# Patient Record
Sex: Female | Born: 1970 | Race: White | Hispanic: No | Marital: Married | Smoking: Never smoker
Health system: Southern US, Community
[De-identification: ages and names within clinical notes are randomized; demographics above are authoritative.]

## PROBLEM LIST (undated history)

## (undated) DIAGNOSIS — E079 Disorder of thyroid, unspecified: Secondary | ICD-10-CM

## (undated) HISTORY — PX: UTERINE STENT PLACEMENT: SHX6655

## (undated) HISTORY — DX: Disorder of thyroid, unspecified: E07.9

## (undated) HISTORY — PX: FOOT SURGERY: SHX648

---

## 2008-07-05 ENCOUNTER — Ambulatory Visit: Payer: Self-pay

## 2008-12-31 ENCOUNTER — Ambulatory Visit: Payer: Self-pay | Admitting: Internal Medicine

## 2009-03-27 ENCOUNTER — Ambulatory Visit: Payer: Self-pay | Admitting: Family Medicine

## 2009-04-12 ENCOUNTER — Ambulatory Visit: Payer: Self-pay | Admitting: Internal Medicine

## 2009-09-23 ENCOUNTER — Ambulatory Visit: Payer: Self-pay | Admitting: Internal Medicine

## 2009-10-04 ENCOUNTER — Ambulatory Visit: Payer: Self-pay | Admitting: Family Medicine

## 2009-10-27 ENCOUNTER — Ambulatory Visit (HOSPITAL_COMMUNITY): Admission: RE | Admit: 2009-10-27 | Discharge: 2009-10-27 | Payer: Self-pay | Admitting: Gastroenterology

## 2012-07-12 ENCOUNTER — Ambulatory Visit: Payer: Self-pay | Admitting: Medical

## 2013-06-10 ENCOUNTER — Ambulatory Visit: Payer: Self-pay | Admitting: Family Medicine

## 2013-06-10 LAB — COMPREHENSIVE METABOLIC PANEL
Albumin: 3.8 g/dL (ref 3.4–5.0)
BUN: 10 mg/dL (ref 7–18)
Calcium, Total: 9.5 mg/dL (ref 8.5–10.1)
Chloride: 104 mmol/L (ref 98–107)
EGFR (African American): >60
Glucose: 113 mg/dL — ABNORMAL HIGH (ref 65–99)
Osmolality: 276 (ref 275–301)
Potassium: 4.3 mmol/L (ref 3.5–5.1)
SGOT(AST): 37 U/L (ref 15–37)
SGPT (ALT): 104 U/L — ABNORMAL HIGH (ref 12–78)

## 2013-06-10 LAB — URINALYSIS, COMPLETE
Bilirubin,UR: NEGATIVE
Glucose,UR: NEGATIVE mg/dL (ref 0–75)
Ketone: NEGATIVE
Protein: NEGATIVE
Specific Gravity: 1.02 (ref 1.003–1.030)

## 2013-06-10 LAB — CBC WITH DIFFERENTIAL/PLATELET
Basophil %: 0.7 %
HCT: 42.1 % (ref 35.0–47.0)
HGB: 14.2 g/dL (ref 12.0–16.0)
Lymphocyte #: 1.6 10*3/uL (ref 1.0–3.6)
Lymphocyte %: 26.7 %
Monocyte %: 9.1 %
Neutrophil %: 62.5 %
Platelet: 289 10*3/uL (ref 150–440)
RDW: 12.9 % (ref 11.5–14.5)

## 2014-04-07 ENCOUNTER — Ambulatory Visit: Payer: Self-pay | Admitting: Podiatry

## 2014-04-08 ENCOUNTER — Encounter: Payer: Self-pay | Admitting: Podiatry

## 2014-04-12 ENCOUNTER — Ambulatory Visit (INDEPENDENT_AMBULATORY_CARE_PROVIDER_SITE_OTHER): Payer: 59

## 2014-04-12 ENCOUNTER — Ambulatory Visit (INDEPENDENT_AMBULATORY_CARE_PROVIDER_SITE_OTHER): Payer: 59 | Admitting: Podiatry

## 2014-04-12 ENCOUNTER — Encounter: Payer: Self-pay | Admitting: Podiatry

## 2014-04-12 VITALS — BP 127/77 | HR 112 | Resp 16 | Ht 64.0 in | Wt 230.0 lb

## 2014-04-12 DIAGNOSIS — M19079 Primary osteoarthritis, unspecified ankle and foot: Secondary | ICD-10-CM

## 2014-04-12 DIAGNOSIS — M79672 Pain in left foot: Secondary | ICD-10-CM

## 2014-04-12 DIAGNOSIS — M2010 Hallux valgus (acquired), unspecified foot: Secondary | ICD-10-CM

## 2014-04-12 DIAGNOSIS — M19072 Primary osteoarthritis, left ankle and foot: Secondary | ICD-10-CM

## 2014-04-12 DIAGNOSIS — M79609 Pain in unspecified limb: Secondary | ICD-10-CM

## 2014-04-12 NOTE — Progress Notes (Signed)
She presents today for surgical consult regarding her left foot. She has a history of osteoarthritis and dorsal spurring midfoot left. She also has a chief complaint of a painful bunion first metatarsophalangeal joint of the left foot. We she was in last, we discussed surgical intervention. Currently she states that her left foot is becoming lifestyle limiting is preventing her from performing her daily activities.  Objective: Vital signs are stable she is alert and oriented x3. She has strong palpable pulses left foot. Pain on palpation to the dorsal aspect of the left foot with overwrapped riding spurring over the second and third metatarsal medial and intermediate cuneiform joint. She also has hallux abductovalgus deformity with an increase in the first intermetatarsal angle of the left foot. This is mildly tender on range of motion. Radiographs confirm increase in the first intermetatarsal angle greater than normal value of the hallux abductus angle greater than normal value. Early dislocation is also noted.   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  Plan: Discussed etiology pathology conservative versus surgical therapies at this point we went over consent form today line bylined number by number giving her ample time to ask questions she saw fit regarding an [place] bunion repair with screw left foot. As well as a dorsal tarsal exostectomy left foot. I answered all the questions regarding these procedures to the best my ability in layman's terms. I demonstrated on for patient the consent form diagrammatically exactly how procedures will be performed she understood this was amenable to it. We discussed the possible postop complications which may include but are not limited to postop pain bleeding swelling infection need for further surgery loss of vision loss in limb loss of life. She signed all 3 pages of the consent form she was given the  appropriate paperwork for the surgery center and was dispensed a Cam Walker for her postop surgical period. I will followup with her in the near future for surgery.

## 2014-06-10 ENCOUNTER — Other Ambulatory Visit: Payer: Self-pay | Admitting: Podiatry

## 2014-06-10 MED ORDER — CEPHALEXIN 500 MG PO CAPS: 500.0000 mg | ORAL_CAPSULE | Freq: Three times a day (TID) | ORAL | Status: DC

## 2014-06-10 MED ORDER — PROMETHAZINE HCL 25 MG PO TABS: 25.0000 mg | ORAL_TABLET | Freq: Three times a day (TID) | ORAL | Status: DC | PRN

## 2014-06-10 MED ORDER — OXYCODONE-ACETAMINOPHEN 10-325 MG PO TABS: ORAL_TABLET | ORAL | Status: DC

## 2014-06-11 ENCOUNTER — Encounter: Payer: Self-pay | Admitting: Podiatry

## 2014-06-11 DIAGNOSIS — M898X9 Other specified disorders of bone, unspecified site: Secondary | ICD-10-CM

## 2014-06-11 DIAGNOSIS — M2010 Hallux valgus (acquired), unspecified foot: Secondary | ICD-10-CM

## 2014-06-15 ENCOUNTER — Telehealth: Payer: Self-pay | Admitting: *Deleted

## 2014-06-15 NOTE — Telephone Encounter (Signed)
Pt called wanting to know if she needed to wear boot 24/7. Left message letting pt know yes, wear boot all day/night until come in to see dr Milinda Pointer.

## 2014-06-15 NOTE — Telephone Encounter (Signed)
Pt called wanting to know if numbness and tingling is normal. i told pt yes normal, stay off of foot, elevate and ice. Pt understood.

## 2014-06-17 ENCOUNTER — Ambulatory Visit (INDEPENDENT_AMBULATORY_CARE_PROVIDER_SITE_OTHER): Payer: 59 | Admitting: Podiatry

## 2014-06-17 ENCOUNTER — Ambulatory Visit (INDEPENDENT_AMBULATORY_CARE_PROVIDER_SITE_OTHER): Payer: 59

## 2014-06-17 ENCOUNTER — Encounter: Payer: Self-pay | Admitting: Podiatry

## 2014-06-17 VITALS — BP 130/83 | HR 92 | Temp 99.0°F | Resp 16

## 2014-06-17 DIAGNOSIS — M21612 Bunion of left foot: Secondary | ICD-10-CM

## 2014-06-17 DIAGNOSIS — M21619 Bunion of unspecified foot: Secondary | ICD-10-CM

## 2014-06-17 NOTE — Progress Notes (Signed)
She presents today one week status post [facility] bunion repair and dorsal tarsal exostectomy left foot. She denies fever chills nausea vomiting muscle aches and pains think that the bandage may been a little too tight.  Objective: Vital signs are stable she is alert and oriented x3. Dry sterile dressing was removed demonstrates mild edema no erythema cellulitis drainage or odor. Sutures are intact and she has good range of motion passive and active. Radiographic evaluation demonstrates a well healing surgical foot.  Assessment: Well-healing surgical foot left.  Plan: Redressed today with a dressed a compressive dressing will followup with her in one week.

## 2014-06-17 NOTE — Progress Notes (Signed)
Austin bunionectomy left foot   Dorsal tarsal exostectomy

## 2014-06-24 ENCOUNTER — Ambulatory Visit (INDEPENDENT_AMBULATORY_CARE_PROVIDER_SITE_OTHER): Payer: 59 | Admitting: Podiatry

## 2014-06-24 VITALS — BP 125/86 | HR 107 | Resp 16

## 2014-06-24 DIAGNOSIS — Z9889 Other specified postprocedural states: Secondary | ICD-10-CM

## 2014-06-24 DIAGNOSIS — M898X9 Other specified disorders of bone, unspecified site: Secondary | ICD-10-CM

## 2014-06-24 DIAGNOSIS — M21612 Bunion of left foot: Secondary | ICD-10-CM

## 2014-06-24 DIAGNOSIS — M21619 Bunion of unspecified foot: Secondary | ICD-10-CM

## 2014-06-24 NOTE — Progress Notes (Signed)
She presents today with her husband now status post 2 weeks [place] bunion repair with screw fixation and a dorsal tarsal exostectomy. She denies fever chills nausea vomiting muscle aches and pains. She states that she's doing very well with this.  Objective: Vital signs are stable she is alert and oriented x3. Pulses are strongly palpable. Dry sterile dressing was removed demonstrates minimal edema no erythema cellulitis drainage or odor sutures are intact margins are well coapted. She has great range of motion dorsiflexion and plantar flexion active and passive ranges. Sutures were removed today.  Assessment: Well-healing surgical foot left status post [place] bunion repair and dorsal tarsal exostectomy x2 weeks.  Plan: Placed her in a compression anklet encouraged range of motion exercises continue the use of the Darco shoe which was dispensed today for the next 3 weeks until I followup with her. She's allowed to get this wet in the shower and then soak in Epsom salts and water. I encouraged massage therapy and range of motion exercises today.

## 2014-06-30 DIAGNOSIS — M79609 Pain in unspecified limb: Secondary | ICD-10-CM

## 2014-07-14 ENCOUNTER — Ambulatory Visit (INDEPENDENT_AMBULATORY_CARE_PROVIDER_SITE_OTHER): Payer: 59 | Admitting: Podiatry

## 2014-07-14 ENCOUNTER — Ambulatory Visit (INDEPENDENT_AMBULATORY_CARE_PROVIDER_SITE_OTHER): Payer: 59

## 2014-07-14 DIAGNOSIS — M2012 Hallux valgus (acquired), left foot: Secondary | ICD-10-CM

## 2014-07-14 DIAGNOSIS — M21612 Bunion of left foot: Secondary | ICD-10-CM

## 2014-07-14 DIAGNOSIS — Z9889 Other specified postprocedural states: Secondary | ICD-10-CM

## 2014-07-14 NOTE — Progress Notes (Signed)
She presents today 5 weeks status post [facility] bunion repair left and dorsal tarsal exostectomy left she relates the numbness and swelling around her first and second toes left foot.  Objective: Vital signs are stable she is alert and oriented x3 pulses are palpable left foot. She has great range of motion of the first metatarsophalangeal joint left with minimal tenderness on palpation of the dorsal exostectomy site.  Assessment: Well-healing surgical foot left.  Plan: Followup with her in 3 weeks at which time we have to get her back into her regular pair shoes.

## 2014-07-19 ENCOUNTER — Encounter: Payer: Self-pay | Admitting: *Deleted

## 2014-08-04 ENCOUNTER — Encounter: Payer: 59 | Admitting: Podiatry

## 2014-08-16 ENCOUNTER — Ambulatory Visit (INDEPENDENT_AMBULATORY_CARE_PROVIDER_SITE_OTHER): Payer: 59

## 2014-08-16 ENCOUNTER — Ambulatory Visit (INDEPENDENT_AMBULATORY_CARE_PROVIDER_SITE_OTHER): Payer: 59 | Admitting: Podiatry

## 2014-08-16 ENCOUNTER — Other Ambulatory Visit: Payer: Self-pay | Admitting: Podiatry

## 2014-08-16 DIAGNOSIS — M21611 Bunion of right foot: Secondary | ICD-10-CM

## 2014-08-16 DIAGNOSIS — M2011 Hallux valgus (acquired), right foot: Secondary | ICD-10-CM

## 2014-08-16 DIAGNOSIS — Z9889 Other specified postprocedural states: Secondary | ICD-10-CM

## 2014-08-16 NOTE — Progress Notes (Signed)
She presents today almost a month status post [place] bunion repair left foot and dorsal tarsal exostectomy left foot. She denies fever chills nausea vomiting muscle aches and pains. She denies any changes in her past medical history medications allergies surgeries and social history.  Objective: Vital signs are stable she is alert and oriented 3. Pulses are palpable. She has a fantastic range of motion of the first metatarsophalangeal joint with no pain. Radiographs demonstrate well healing surgical foot left.  Assessment: Well-healing surgical foot.  Plan: At this point we will try to get her back into regular shoe gear and try to get her back to work as soon as possible. I will follow-up with her in 6 weeks.

## 2014-09-20 DIAGNOSIS — IMO0001 Reserved for inherently not codable concepts without codable children: Secondary | ICD-10-CM | POA: Insufficient documentation

## 2014-09-20 DIAGNOSIS — R945 Abnormal results of liver function studies: Secondary | ICD-10-CM | POA: Insufficient documentation

## 2014-09-20 DIAGNOSIS — R7989 Other specified abnormal findings of blood chemistry: Secondary | ICD-10-CM | POA: Insufficient documentation

## 2014-09-29 ENCOUNTER — Encounter: Payer: 59 | Admitting: Podiatry

## 2014-10-18 ENCOUNTER — Ambulatory Visit: Payer: Self-pay

## 2014-10-18 LAB — CBC WITH DIFFERENTIAL/PLATELET
Basophil #: 0.1 10*3/uL (ref 0.0–0.1)
Basophil %: 0.9 %
EOS PCT: 1.6 %
Eosinophil #: 0.1 10*3/uL (ref 0.0–0.7)
HCT: 43.4 % (ref 35.0–47.0)
HGB: 14.5 g/dL (ref 12.0–16.0)
LYMPHS ABS: 1.9 10*3/uL (ref 1.0–3.6)
LYMPHS PCT: 32.2 %
MCH: 30.8 pg (ref 26.0–34.0)
MCHC: 33.3 g/dL (ref 32.0–36.0)
MCV: 92 fL (ref 80–100)
MONOS PCT: 6.7 %
Monocyte #: 0.4 x10 3/mm (ref 0.2–0.9)
Neutrophil #: 3.4 10*3/uL (ref 1.4–6.5)
Neutrophil %: 58.6 %
PLATELETS: 304 10*3/uL (ref 150–440)
RBC: 4.7 10*6/uL (ref 3.80–5.20)
RDW: 13.1 % (ref 11.5–14.5)
WBC: 5.8 10*3/uL (ref 3.6–11.0)

## 2014-10-18 LAB — URINALYSIS, COMPLETE
GLUCOSE, UR: NEGATIVE
Leukocyte Esterase: NEGATIVE
Nitrite: NEGATIVE
Ph: 6 (ref 5.0–8.0)

## 2014-10-18 LAB — COMPREHENSIVE METABOLIC PANEL
ANION GAP: 9 (ref 7–16)
Albumin: 3.8 g/dL (ref 3.4–5.0)
Alkaline Phosphatase: 56 U/L
BILIRUBIN TOTAL: 0.5 mg/dL (ref 0.2–1.0)
BUN: 16 mg/dL (ref 7–18)
CHLORIDE: 105 mmol/L (ref 98–107)
CREATININE: 0.69 mg/dL (ref 0.60–1.30)
Calcium, Total: 9.1 mg/dL (ref 8.5–10.1)
Co2: 26 mmol/L (ref 21–32)
EGFR (Non-African Amer.): >60
Glucose: 107 mg/dL — ABNORMAL HIGH (ref 65–99)
OSMOLALITY: 281 (ref 275–301)
POTASSIUM: 4 mmol/L (ref 3.5–5.1)
SGOT(AST): 42 U/L — ABNORMAL HIGH (ref 15–37)
SGPT (ALT): 135 U/L — ABNORMAL HIGH
Sodium: 140 mmol/L (ref 136–145)
TOTAL PROTEIN: 7.9 g/dL (ref 6.4–8.2)

## 2014-10-18 LAB — LIPASE, BLOOD: Lipase: 102 U/L (ref 73–393)

## 2014-10-18 LAB — AMYLASE: AMYLASE: 42 U/L (ref 25–115)

## 2014-10-18 LAB — PREGNANCY, URINE: PREGNANCY TEST, URINE: NEGATIVE m[IU]/mL

## 2014-10-20 LAB — URINE CULTURE

## 2015-04-25 ENCOUNTER — Encounter: Payer: Self-pay | Admitting: Emergency Medicine

## 2015-04-25 ENCOUNTER — Ambulatory Visit
Admission: EM | Admit: 2015-04-25 | Discharge: 2015-04-25 | Disposition: A | Discharge status: Home / Self Care | Payer: 59 | Attending: Internal Medicine | Admitting: Internal Medicine

## 2015-04-25 DIAGNOSIS — R1084 Generalized abdominal pain: Secondary | ICD-10-CM

## 2015-04-25 DIAGNOSIS — Z87442 Personal history of urinary calculi: Secondary | ICD-10-CM

## 2015-04-25 DIAGNOSIS — R109 Unspecified abdominal pain: Secondary | ICD-10-CM | POA: Diagnosis not present

## 2015-04-25 DIAGNOSIS — N920 Excessive and frequent menstruation with regular cycle: Secondary | ICD-10-CM | POA: Insufficient documentation

## 2015-04-25 DIAGNOSIS — R350 Frequency of micturition: Secondary | ICD-10-CM | POA: Diagnosis present

## 2015-04-25 LAB — URINALYSIS COMPLETE WITH MICROSCOPIC (ARMC ONLY)
Bilirubin Urine: NEGATIVE
Glucose, UA: NEGATIVE mg/dL
KETONES UR: NEGATIVE mg/dL
Leukocytes, UA: NEGATIVE
Nitrite: NEGATIVE
PH: 5.5 (ref 5.0–8.0)
PROTEIN: NEGATIVE mg/dL
Specific Gravity, Urine: 1.025 (ref 1.005–1.030)

## 2015-04-25 MED ORDER — PHENAZOPYRIDINE HCL 200 MG PO TABS: 200.0000 mg | ORAL_TABLET | Freq: Three times a day (TID) | ORAL | Status: DC

## 2015-04-25 MED ORDER — NITROFURANTOIN MONOHYD MACRO 100 MG PO CAPS: 100.0000 mg | ORAL_CAPSULE | Freq: Two times a day (BID) | ORAL | Status: DC

## 2015-04-25 NOTE — ED Provider Notes (Signed)
CSN: [id number]     Arrival date & time 04/25/15  0844 History   First MD Initiated Contact with Patient 04/25/15 1006     Chief Complaint  Patient presents with  . Urinary Frequency  . Abdominal Pain   (Consider location/radiation/quality/duration/timing/severity/associated sxs/prior Treatment) HPI Comments: Caucasian female here for evaluation of left greater than right abdomen pain; history nephrolithiasis with pregnancy 10 years ago and UTIs last greater than 1 year ago has taken macrobid without difficulty in the past; currently on menses here to be seen because vacation planned starting 21 Jul will be out of town.  Low abdomen/pelvic pressure in past has been a UTI for her.  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 female presenting with frequency and abdominal pain. The history is provided by the patient.  Urinary Frequency This is a new problem. The current episode started more than 1 week ago. The problem occurs constantly. The problem has not changed since onset.Associated symptoms include abdominal pain. Pertinent negatives include no chest pain, no headaches and no shortness of breath. Nothing aggravates the symptoms. Nothing relieves the symptoms. She has tried food, water and rest for the symptoms. The treatment provided no relief.  Abdominal Pain Associated symptoms: dysuria and vaginal bleeding   Associated symptoms: no chest pain, no chills, no constipation, no cough, no diarrhea, no fatigue, no fever, no shortness of breath, no sore throat, no vaginal discharge and no vomiting     Past Medical  History  Diagnosis Date  . Thyroid disease    Past Surgical History  Procedure Laterality Date  . Cesarean section    . Foot surgery Left    Family History  Problem Relation Age of Onset  . Hypertension Father    History  Substance Use Topics  . Smoking status: Never Smoker   . Smokeless tobacco: Not on file  . Alcohol Use: No   OB History    No data available     Review of Systems  Constitutional: Negative for fever, chills, diaphoresis, activity change, appetite change and fatigue.  HENT: Negative for congestion, ear discharge, ear pain, facial swelling, postnasal drip, rhinorrhea, sneezing, sore throat, tinnitus, trouble swallowing and voice change.   Eyes: Negative for photophobia, pain, discharge, redness, itching and visual disturbance.  Respiratory: Negative for cough, shortness of breath and wheezing.   Cardiovascular: Negative for chest pain, palpitations and leg swelling.  Gastrointestinal: Positive for abdominal pain. Negative for vomiting, diarrhea, constipation, blood in stool, abdominal distention, anal bleeding and rectal pain.  Endocrine: Negative for cold intolerance and heat intolerance.  Genitourinary: Positive for dysuria, frequency, vaginal bleeding, menstrual problem and pelvic pain. Negative for flank pain, decreased urine volume, vaginal discharge, enuresis, difficulty urinating, genital sores and vaginal pain.  Musculoskeletal: Negative for myalgias, back pain, joint swelling, arthralgias, gait problem, neck pain and neck stiffness.  Skin: Negative for color change, pallor, rash and wound.  Allergic/Immunologic: Negative for environmental allergies and food allergies.  Neurological: Negative for dizziness, tremors, syncope, facial asymmetry, weakness, light-headedness and headaches.  Hematological: Negative for adenopathy. Does not bruise/bleed easily.  Psychiatric/Behavioral: Negative  for behavioral problems, confusion, sleep disturbance and agitation.     Allergies  Review of patient's allergies indicates no known allergies.  Home Medications   Prior to Admission medications   Medication Sig Start Date End Date Taking? Authorizing Provider  thyroid (ARMOUR) 30 MG tablet Take 30 mg by mouth daily before breakfast.   Yes Historical Provider, MD  nitrofurantoin, macrocrystal-monohydrate, (MACROBID) 100 MG capsule Take 1 capsule (100 mg total) by mouth 2 (two) times daily. 04/25/15   Olen Cordial, NP  phenazopyridine (PYRIDIUM) 200 MG tablet Take 1 tablet (200 mg total) by mouth 3 (three) times daily. 04/25/15   Aura Fey Betancourt, NP   BP 117/86 mmHg  Pulse 93  Temp(Src) 97.6 F (36.4 C) (Tympanic)  Resp 16  Ht 5\' 4"  (1.626 m)  Wt 225 lb (102.059 kg)  BMI 38.60 kg/m2  SpO2 99%  LMP 04/21/2015 (Exact Date) Physical Exam  Constitutional: She is oriented to person, place, and time. Vital signs are normal. She appears well-developed and well-nourished. No distress.  HENT:  Head: Normocephalic and atraumatic.  Right Ear: External ear normal.  Left Ear: External ear normal.  Nose: Nose normal.  Mouth/Throat: Oropharynx is clear and moist. No oropharyngeal exudate.  Eyes: Conjunctivae, EOM and lids are normal. Pupils are equal, round, and reactive to light. Right eye exhibits no discharge. Left eye exhibits no discharge. No scleral icterus.  Neck: Trachea normal and normal range of motion. Neck supple. No tracheal deviation present. No thyromegaly present.  Cardiovascular: Normal rate, regular rhythm, normal heart sounds and intact distal pulses.  Exam reveals no gallop and no friction rub.   No murmur heard. Pulmonary/Chest: Effort normal and breath sounds normal. No stridor. No respiratory distress. She has no wheezes. She has no rales. She exhibits no tenderness.  Abdominal: Soft. Bowel sounds are normal. She exhibits no shifting dullness, no distension, no pulsatile liver, no fluid wave, no abdominal bruit, no ascites, no pulsatile  midline mass and no mass. There is no hepatosplenomegaly. There is generalized tenderness. There is no rigidity, no rebound, no guarding, no CVA tenderness, no tenderness at McBurney's point and negative Murphy's sign. Hernia confirmed negative in the ventral area.  Dull to percussion x 4 quads  Musculoskeletal: Normal range of motion.  Lymphadenopathy:    She has no cervical adenopathy.  Neurological: She is alert and oriented to person, place, and time. She exhibits normal muscle tone. Coordination normal.  Skin: Skin is warm, dry and intact. No rash noted. She is not diaphoretic. No erythema. No pallor.  Psychiatric: She has a normal mood and affect. Her speech is normal and behavior is normal. Judgment and thought content normal. Cognition and memory are normal.  Nursing note and vitals reviewed.   ED Course  Procedures (including critical care time) Labs Review Labs Reviewed  URINALYSIS COMPLETEWITH MICROSCOPIC ([place]) - Abnormal; Notable for the following:    Hgb urine dipstick 2+ (*)    Squamous Epithelial / LPF 0-5 (*)    All other components within normal limits  URINE CULTURE    Imaging Review No results found.   MDM   1. History of kidney stones   2. Generalized abdominal pain   3. Menorrhagia with regular cycle    Discussed urinalysis results with patient and urine culture pending over next 48 hours. Currently on menses unsure if hematuria or not.  Patient feels her current symptoms same as previous UTIs.  Hx normal ua's but symptoms resolve on antibiotics.  Will call patient with urine culture results when available.  If dysuria, fever, chills to start macrobid otherwise pyridium 200mg  po TID until urine culture results known.  Patient with history of symptoms similar to this for previous kidney stone 10 years ago.  Strain all urine.  Hydrate, hydrate.   Flomax 0.4mg  po daily as directed.  Patient did not want antinausea medication at this time.  Consider imaging  and further labs if decreased urine flow, worsening pain. Call or return to clinic as needed if these symptoms worsen or fail to improve as anticipated e.g. gross hematuria, fever, worsening pain, unable to void.  Exitcare handout on nephrolithiasis given to patient.  Patient verbalized agreement and understanding of treatment plan and had no further questions at this time. P2:  Hydrate, cranberry juice    GYN versus nephrolithiasis versus viral gastroenteritis as global abdomen discomfort  TTP anywhere I press on abdomen/pelvis all equally uncomfortable.  Discussed consider vaginal ultrasound cannot be performed at [facility] but would need to be scheduled with GYN or [place].  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  Discussed with patient if TSH out of range can have impact on menses as can +HPV.  If no stone passed/observed while straining urine I have recommended clear fluids and the BRAT diet.  Medications as directed.  Return to the clinic if  symptoms persist or worsen; I have alerted the patient to call if high fever, unable to urinate every 8 hours, dehydration, marked weakness, fainting, increased abdominal pain, blood in stool or vomit. Patient to follow up with GYN/PCM if no improvement over next 48 hours with prescribed treatment.   Patient verbalized agreement and understanding of treatment plan and had no further questions at this time.   P2:  Hand washing and fitness   Olen Cordial, NP 04/25/15 1441

## 2015-04-25 NOTE — Discharge Instructions (Signed)
Abdominal Pain, Women °Abdominal (stomach, pelvic, or belly) pain can be caused by many things. It is important to tell your doctor: °· The location of the pain. °· Does it come and go or is it present all the time? °· Are there things that start the pain (eating certain foods, exercise)? °· Are there other symptoms associated with the pain (fever, nausea, vomiting, diarrhea)? °All of this is helpful to know when trying to find the cause of the pain. °CAUSES  °· Stomach: virus or bacteria infection, or ulcer. °· Intestine: appendicitis (inflamed appendix), regional ileitis (Crohn's disease), ulcerative colitis (inflamed colon), irritable bowel syndrome, diverticulitis (inflamed diverticulum of the colon), or cancer of the stomach or intestine. °· Gallbladder disease or stones in the gallbladder. °· Kidney disease, kidney stones, or infection. °· Pancreas infection or cancer. °· Fibromyalgia (pain disorder). °· Diseases of the female organs: °· Uterus: fibroid (non-cancerous) tumors or infection. °· Fallopian tubes: infection or tubal pregnancy. °· Ovary: cysts or tumors. °· Pelvic adhesions (scar tissue). °· Endometriosis (uterus lining tissue growing in the pelvis and on the pelvic organs). °· Pelvic congestion syndrome (female organs filling up with blood just before the menstrual period). °· Pain with the menstrual period. °· Pain with ovulation (producing an egg). °· Pain with an IUD (intrauterine device, birth control) in the uterus. °· Cancer of the female organs. °· Functional pain (pain not caused by a disease, may improve without treatment). °· Psychological pain. °· Depression. °DIAGNOSIS  °Your doctor will decide the seriousness of your pain by doing an examination. °· Blood tests. °· X-rays. °· Ultrasound. °· CT scan (computed tomography, special type of X-ray). °· MRI (magnetic resonance imaging). °· Cultures, for infection. °· Barium enema (dye inserted in the large intestine, to better view it with  X-rays). °· Colonoscopy (looking in intestine with a lighted tube). °· Laparoscopy (minor surgery, looking in abdomen with a lighted tube). °· Major abdominal exploratory surgery (looking in abdomen with a large incision). °TREATMENT  °The treatment will depend on the cause of the pain.  °· Many cases can be observed and treated at home. °· Over-the-counter medicines recommended by your caregiver. °· Prescription medicine. °· Antibiotics, for infection. °· Birth control pills, for painful periods or for ovulation pain. °· Hormone treatment, for endometriosis. °· Nerve blocking injections. °· Physical therapy. °· Antidepressants. °· Counseling with a psychologist or psychiatrist. °· Minor or major surgery. °HOME CARE INSTRUCTIONS  °· Do not take laxatives, unless directed by your caregiver. °· Take over-the-counter pain medicine only if ordered by your caregiver. Do not take aspirin because it can cause an upset stomach or bleeding. °· Try a clear liquid diet (broth or water) as ordered by your caregiver. Slowly move to a bland diet, as tolerated, if the pain is related to the stomach or intestine. °· Have a thermometer and take your temperature several times a day, and record it. °· Bed rest and sleep, if it helps the pain. °· Avoid sexual intercourse, if it causes pain. °· Avoid stressful situations. °· Keep your follow-up appointments and tests, as your caregiver orders. °· If the pain does not go away with medicine or surgery, you may try: °· Acupuncture. °· Relaxation exercises (yoga, meditation). °· Group therapy. °· Counseling. °SEEK MEDICAL CARE IF:  °· You notice certain foods cause stomach pain. °· Your home care treatment is not helping your pain. °· You need stronger pain medicine. °· You want your IUD removed. °· You feel faint or   lightheaded.  You develop nausea and vomiting.  You develop a rash.  You are having side effects or an allergy to your medicine. SEEK IMMEDIATE MEDICAL CARE IF:   Your  pain does not go away or gets worse.  You have a fever.  Your pain is felt only in portions of the abdomen. The right side could possibly be appendicitis. The left lower portion of the abdomen could be colitis or diverticulitis.  You are passing blood in your stools (bright red or black tarry stools, with or without vomiting).  You have blood in your urine.  You develop chills, with or without a fever.  You pass out. MAKE SURE YOU:   Understand these instructions.  Will watch your condition.  Will get help right away if you are not doing well or get worse. Document Released: 07/22/2007 Document Revised: 02/08/2014 Document Reviewed: 08/11/2009 [facility] Patient Information 2015 [place], [place]. This information is not intended to replace advice given to you by your health care provider. Make sure you discuss any questions you have with your health care provider. Kidney Stones Kidney stones (urolithiasis) are deposits that form inside your kidneys. The intense pain is caused by the stone moving through the urinary tract. When the stone moves, the ureter goes into spasm around the stone. The stone is usually passed in the urine.  CAUSES   A disorder that makes certain neck glands produce too much parathyroid hormone (primary hyperparathyroidism).  A buildup of uric acid crystals, similar to gout in your joints.  Narrowing (stricture) of the ureter.  A kidney obstruction present at birth (congenital obstruction).  Previous surgery on the kidney or ureters.  Numerous kidney infections. SYMPTOMS   Feeling sick to your stomach (nauseous).  Throwing up (vomiting).  Blood in the urine (hematuria).  Pain that usually spreads (radiates) to the groin.  Frequency or urgency of urination. DIAGNOSIS   Taking a history and physical exam.  Blood or urine tests.  CT scan.  Occasionally, an examination of the inside of the urinary bladder (cystoscopy) is performed. TREATMENT    Observation.  Increasing your fluid intake.  Extracorporeal shock wave lithotripsy--This is a noninvasive procedure that uses shock waves to break up kidney stones.  Surgery may be needed if you have severe pain or persistent obstruction. There are various surgical procedures. Most of the procedures are performed with the use of small instruments. Only small incisions are needed to accommodate these instruments, so recovery time is minimized. The size, location, and chemical composition are all important variables that will determine the proper choice of action for you. Talk to your health care provider to better understand your situation so that you will minimize the risk of injury to yourself and your kidney.  HOME CARE INSTRUCTIONS   Drink enough water and fluids to keep your urine clear or pale yellow. This will help you to pass the stone or stone fragments.  Strain all urine through the provided strainer. Keep all particulate matter and stones for your health care provider to see. The stone causing the pain may be as small as a grain of salt. It is very important to use the strainer each and every time you pass your urine. The collection of your stone will allow your health care provider to analyze it and verify that a stone has actually passed. The stone analysis will often identify what you can do to reduce the incidence of recurrences.  Only take over-the-counter or prescription medicines for pain, discomfort,  or fever as directed by your health care provider.  Make a follow-up appointment with your health care provider as directed.  Get follow-up X-rays if required. The absence of pain does not always mean that the stone has passed. It may have only stopped moving. If the urine remains completely obstructed, it can cause loss of kidney function or even complete destruction of the kidney. It is your responsibility to make sure X-rays and follow-ups are completed. Ultrasounds of the  kidney can show blockages and the status of the kidney. Ultrasounds are not associated with any radiation and can be performed easily in a matter of minutes. SEEK MEDICAL CARE IF:  You experience pain that is progressive and unresponsive to any pain medicine you have been prescribed. SEEK IMMEDIATE MEDICAL CARE IF:   Pain cannot be controlled with the prescribed medicine.  You have a fever or shaking chills.  The severity or intensity of pain increases over 18 hours and is not relieved by pain medicine.  You develop a new onset of abdominal pain.  You feel faint or pass out.  You are unable to urinate. MAKE SURE YOU:   Understand these instructions.  Will watch your condition.  Will get help right away if you are not doing well or get worse. Document Released: 09/24/2005 Document Revised: 05/27/2013 Document Reviewed: 02/25/2013 [facility] Patient Information 2015 [place], [place]. This information is not intended to replace advice given to you by your health care provider. Make sure you discuss any questions you have with your health care provider. Menorrhagia Menorrhagia is the medical term for when your menstrual periods are heavy or last longer than usual. With menorrhagia, every period you have may cause enough blood loss and cramping that you are unable to maintain your usual activities. CAUSES  In some cases, the cause of heavy periods is unknown, but a number of conditions may cause menorrhagia. Common causes include:  A problem with the hormone-producing thyroid gland (hypothyroid).  Noncancerous growths in the uterus (polyps or fibroids).  An imbalance of the estrogen and progesterone hormones.  One of your ovaries not releasing an egg during one or more months.  Side effects of having an intrauterine device (IUD).  Side effects of some medicines, such as anti-inflammatory medicines or blood thinners.  A bleeding disorder that stops your blood from clotting  normally. SIGNS AND SYMPTOMS  During a normal period, bleeding lasts between 4 and 8 days. Signs that your periods are too heavy include:  You routinely have to change your pad or tampon every 1 or 2 hours because it is completely soaked.  You pass blood clots larger than 1 inch (2.5 cm) in size.  You have bleeding for more than 7 days.  You need to use pads and tampons at the same time because of heavy bleeding.  You need to wake up to change your pads or tampons during the night.  You have symptoms of anemia, such as tiredness, fatigue, or shortness of breath. DIAGNOSIS  Your health care provider will perform a physical exam and ask you questions about your symptoms and menstrual history. Other tests may be ordered based on what the health care provider finds during the exam. These tests can include:  Blood tests. Blood tests are used to check if you are pregnant or have hormonal changes, a bleeding or thyroid disorder, low iron levels (anemia), or other problems.  Endometrial biopsy. Your health care provider takes a sample of tissue from the inside of your  uterus to be examined under a microscope.  Pelvic ultrasound. This test uses sound waves to make a picture of your uterus, ovaries, and vagina. The pictures can show if you have fibroids or other growths.  Hysteroscopy. For this test, your health care provider will use a small telescope to look inside your uterus. Based on the results of your initial tests, your health care provider may recommend further testing. TREATMENT  Treatment may not be needed. If it is needed, your health care provider may recommend treatment with one or more medicines first. If these do not reduce bleeding enough, a surgical treatment might be an option. The best treatment for you will depend on:   Whether you need to prevent pregnancy.  Your desire to have children in the future.  The cause and severity of your bleeding.  Your opinion and  personal preference.  Medicines for menorrhagia may include:  Birth control methods that use hormones. These include the pill, skin patch, vaginal ring, shots that you get every 3 months, hormonal IUD, and implant. These treatments reduce bleeding during your menstrual period.  Medicines that thicken blood and slow bleeding.  Medicines that reduce swelling, such as ibuprofen.  Medicines that contain a synthetic hormone called progestin.   Medicines that make the ovaries stop working for a short time.  You may need surgical treatment for menorrhagia if the medicines are unsuccessful. Treatment options include:  Dilation and curettage (D&C). In this procedure, your health care provider opens (dilates) your cervix and then scrapes or suctions tissue from the lining of your uterus to reduce menstrual bleeding.  Operative hysteroscopy. This procedure uses a tiny tube with a light (hysteroscope) to view your uterine cavity and can help in the surgical removal of a polyp that may be causing heavy periods.  Endometrial ablation. Through various techniques, your health care provider permanently destroys the entire lining of your uterus (endometrium). After endometrial ablation, most women have little or no menstrual flow. Endometrial ablation reduces your ability to become pregnant.  Endometrial resection. This surgical procedure uses an electrosurgical wire loop to remove the lining of the uterus. This procedure also reduces your ability to become pregnant.  Hysterectomy. Surgical removal of the uterus and cervix is a permanent procedure that stops menstrual periods. Pregnancy is not possible after a hysterectomy. This procedure requires anesthesia and hospitalization. HOME CARE INSTRUCTIONS   Only take over-the-counter or prescription medicines as directed by your health care provider. Take prescribed medicines exactly as directed. Do not change or switch medicines without consulting your  health care provider.  Take any prescribed iron pills exactly as directed by your health care provider. Long-term heavy bleeding may result in low iron levels. Iron pills help replace the iron your body lost from heavy bleeding. Iron may cause constipation. If this becomes a problem, increase the bran, fruits, and roughage in your diet.  Do not take aspirin or medicines that contain aspirin 1 week before or during your menstrual period. Aspirin may make the bleeding worse.  If you need to change your sanitary pad or tampon more than once every 2 hours, stay in bed and rest as much as possible until the bleeding stops.  Eat well-balanced meals. Eat foods high in iron. Examples are leafy green vegetables, meat, liver, eggs, and whole grain breads and cereals. Do not try to lose weight until the abnormal bleeding has stopped and your blood iron level is back to normal. SEEK MEDICAL CARE IF:   You soak  through a pad or tampon every 1 or 2 hours, and this happens every time you have a period.  You need to use pads and tampons at the same time because you are bleeding so much.  You need to change your pad or tampon during the night.  You have a period that lasts for more than 8 days.  You pass clots bigger than 1 inch wide.  You have irregular periods that happen more or less often than once a month.  You feel dizzy or faint.  You feel very weak or tired.  You feel short of breath or feel your heart is beating too fast when you exercise.  You have nausea and vomiting or diarrhea while you are taking your medicine.  You have any problems that may be related to the medicine you are taking. SEEK IMMEDIATE MEDICAL CARE IF:   You soak through 4 or more pads or tampons in 2 hours.  You have any bleeding while you are pregnant. MAKE SURE YOU:   Understand these instructions.  Will watch your condition.  Will get help right away if you are not doing well or get worse. Document Released:  09/24/2005 Document Revised: 09/29/2013 Document Reviewed: 03/15/2013 [facility] Patient Information 2015 [place], [place]. This information is not intended to replace advice given to you by your health care provider. Make sure you discuss any questions you have with your health care provider.

## 2015-04-25 NOTE — ED Notes (Signed)
Patient c/o urinary frequency and left sided lower abdominal pain for a week.  Patient denies fever or chills.  Patient is currently on her menstrual cycle.

## 2015-04-27 LAB — URINE CULTURE: SPECIAL REQUESTS: NORMAL

## 2015-05-16 ENCOUNTER — Encounter: Payer: Self-pay | Admitting: Registered Nurse

## 2015-08-24 DIAGNOSIS — N9412 Deep dyspareunia: Secondary | ICD-10-CM | POA: Insufficient documentation

## 2015-08-24 DIAGNOSIS — N946 Dysmenorrhea, unspecified: Secondary | ICD-10-CM | POA: Insufficient documentation

## 2015-08-24 DIAGNOSIS — D250 Submucous leiomyoma of uterus: Secondary | ICD-10-CM | POA: Insufficient documentation

## 2015-08-24 DIAGNOSIS — N882 Stricture and stenosis of cervix uteri: Secondary | ICD-10-CM | POA: Insufficient documentation

## 2015-11-09 HISTORY — PX: ABDOMINAL HYSTERECTOMY: SHX81

## 2015-11-18 DIAGNOSIS — Z9889 Other specified postprocedural states: Secondary | ICD-10-CM

## 2015-11-18 DIAGNOSIS — R112 Nausea with vomiting, unspecified: Secondary | ICD-10-CM | POA: Insufficient documentation

## 2015-12-02 IMAGING — CT CT STONE STUDY
2 of 4 series · 16 of 46 positions shown, 18 images · non-contrast
Comparison: None.

CLINICAL DATA: 43-year-old female with lower left and right
quadrant pain. Prior C-section. Initial encounter.

EXAM:
CT ABDOMEN AND PELVIS WITHOUT CONTRAST
TECHNIQUE: Multidetector CT imaging of the abdomen and pelvis was performed
following the standard protocol without IV contrast.

[Series 2: soft tissue · axial · 0.84mm/px · z∈[-968,-508]mm · 13 of 100 slices shown, 15 images]
[im 4/100  soft-tissue]
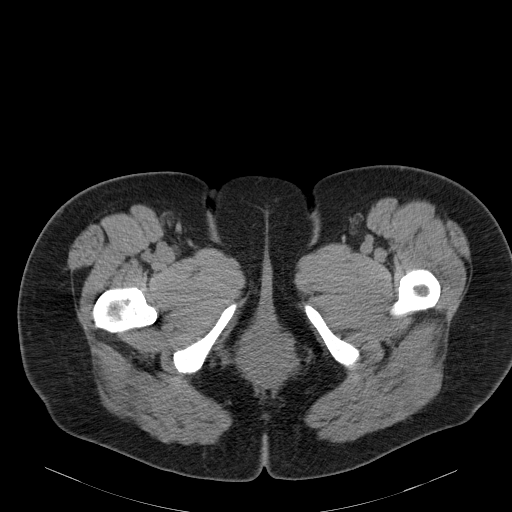
[im 4/100  bone]
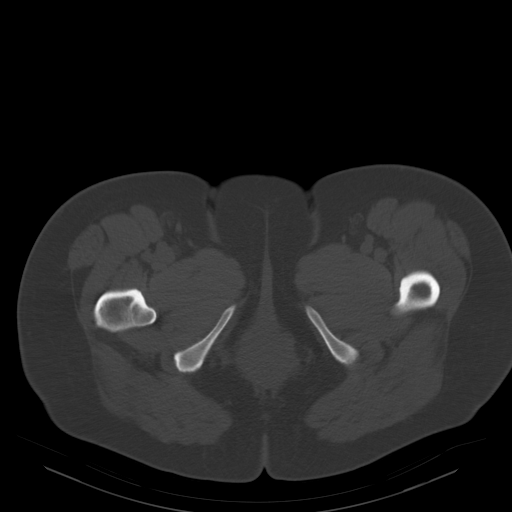
[im 12/100  soft-tissue]
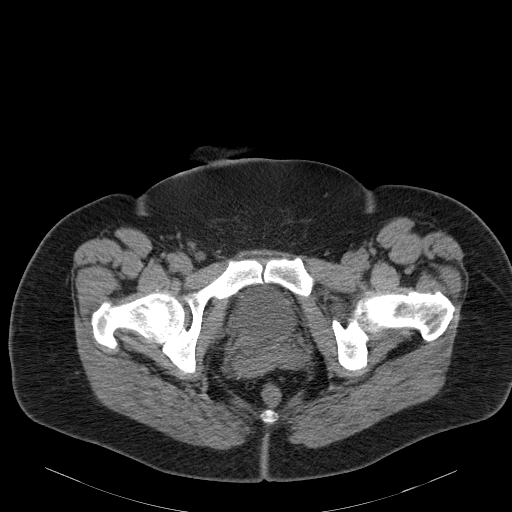
[im 20/100  soft-tissue]
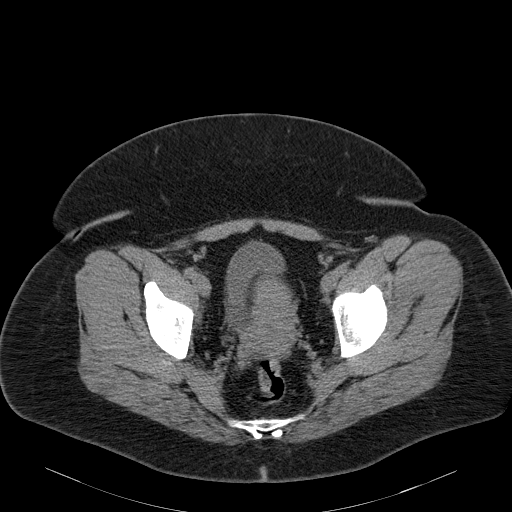
[im 28/100  soft-tissue]
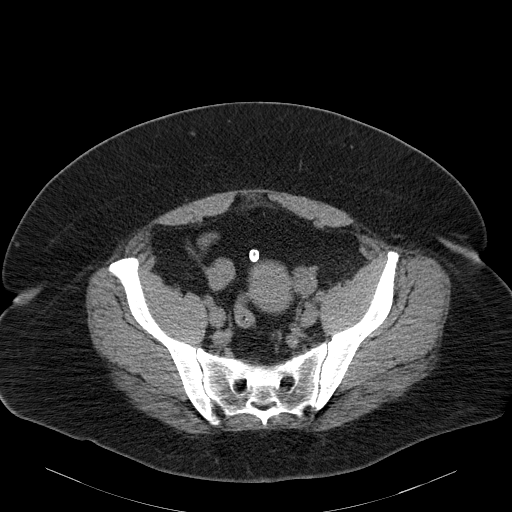
[im 36/100  soft-tissue]
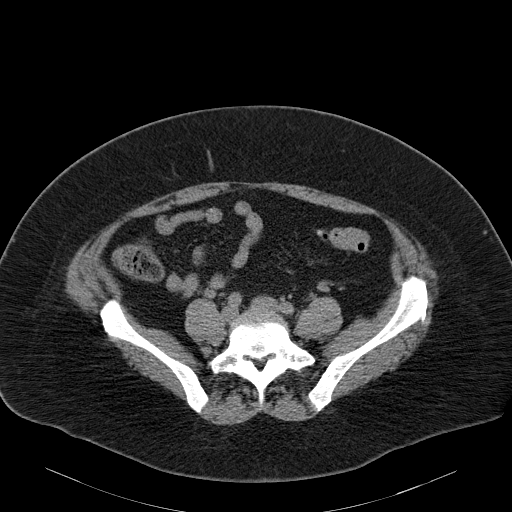
[im 44/100  soft-tissue]
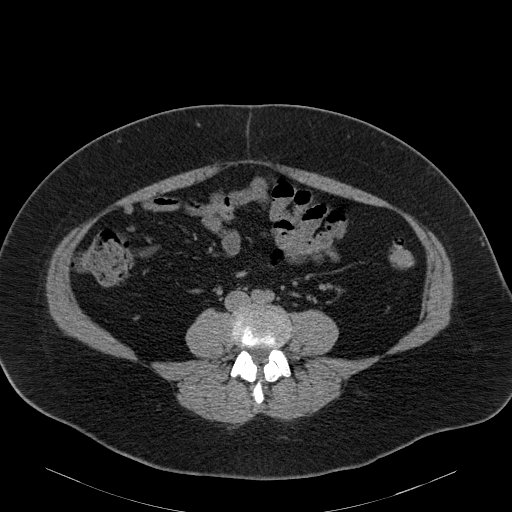
[im 52/100  soft-tissue]
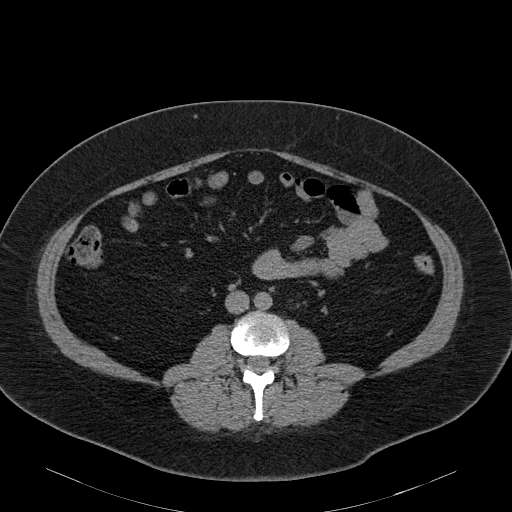
[im 56/100  soft-tissue]
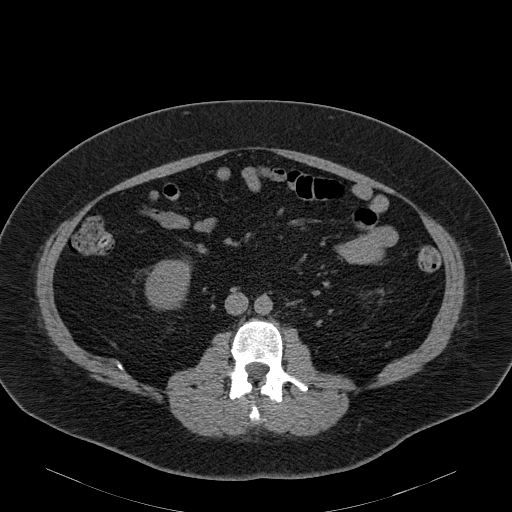
[im 64/100  soft-tissue]
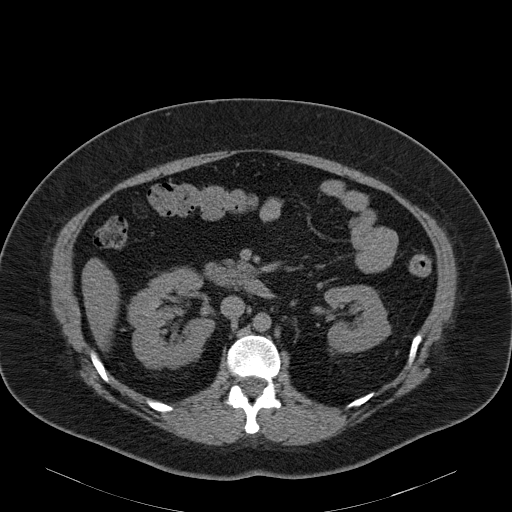
[im 64/100  bone]
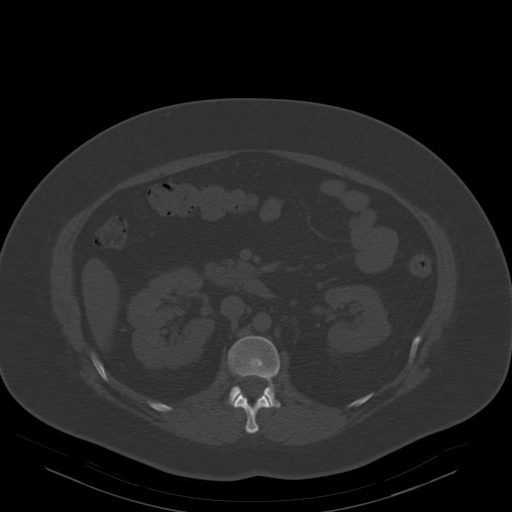
[im 72/100  soft-tissue]
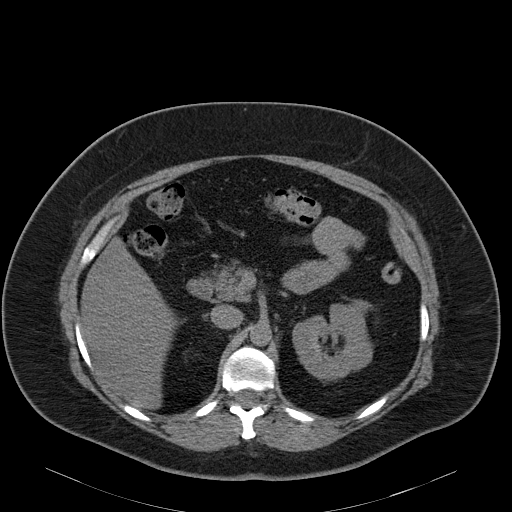
[im 80/100  soft-tissue]
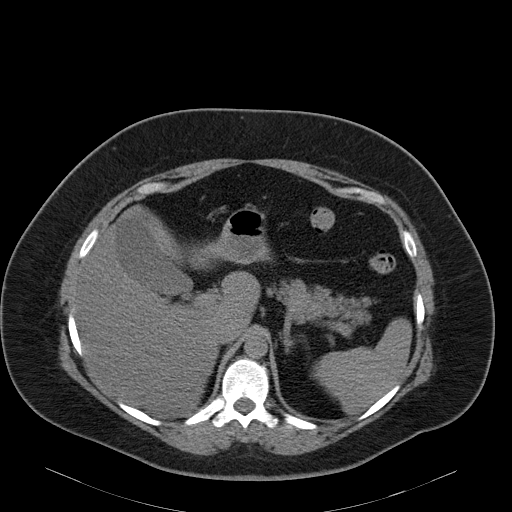
[im 88/100  soft-tissue]
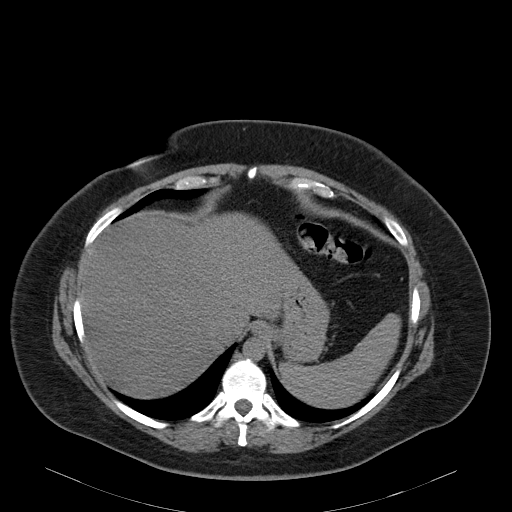
[im 96/100  soft-tissue]
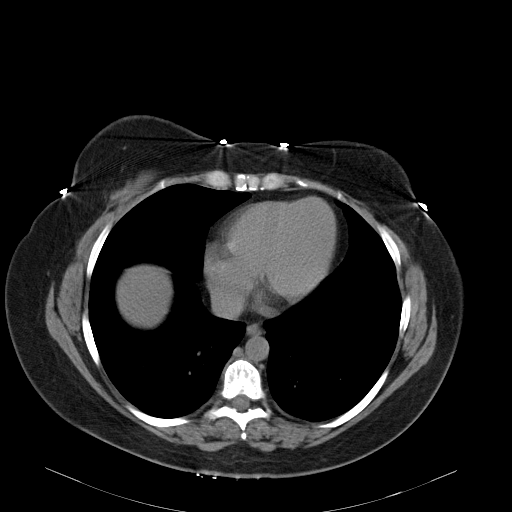

[Series 602: coronal · coronal · 0.97mm/px · 3 of 126 slices shown]
[im 42/126  soft-tissue]
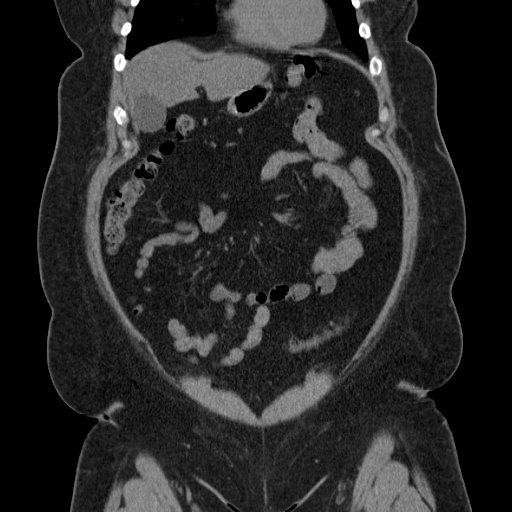
[im 56/126  soft-tissue]
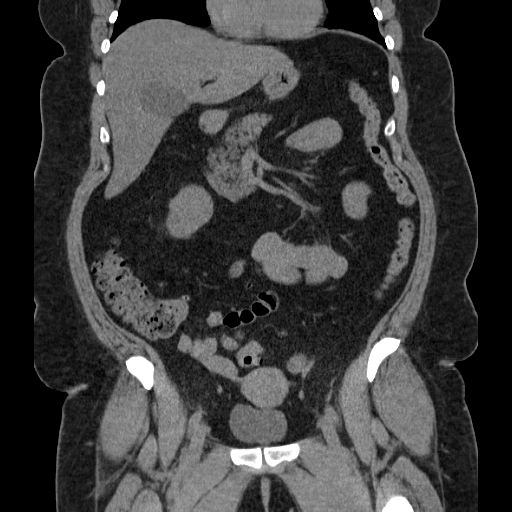
[im 70/126  soft-tissue]
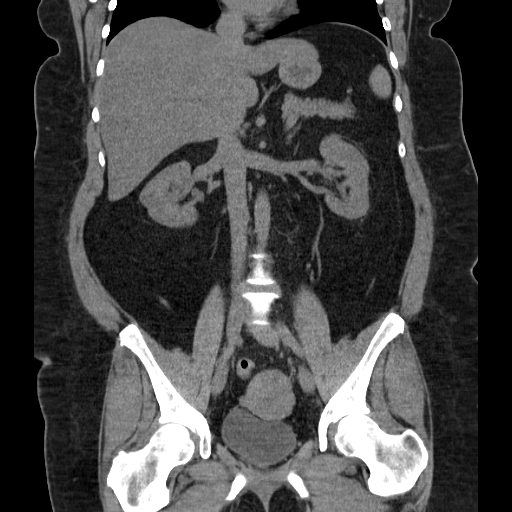

[16 of 46 positions shown; findings below may reference images not displayed]

FINDINGS: Lung bases clear. Inferior aspect of breast implants noted. Heart
size within normal limits.

Scattered colonic diverticula most notable sigmoid colon without
extra luminal bowel inflammatory process, free fluid or free air.
Specifically, no inflammation surrounds the terminal ileum,
diverticula or appendix.

Slightly lobulated contour of the kidneys greater on the right
without discrete worrisome renal mass identified on this unenhanced
exam. Sub cm cyst lower pole right kidney suspected. Additionally,
the renal pyramids appear slightly dense raising possibility of
medullary sponge kidney. No renal or ureteral obstructing stone or
findings to suggest hydronephrosis.

Fatty liver. Taking into account limitation by non contrast imaging,
no worrisome hepatic, splenic, pancreatic or adrenal lesion. No
calcified gallstone.

No abdominal aortic aneurysm.  No adenopathy.

Degenerative changes lower thoracic and lumbar spine without osseous
destructive lesion.

Coarse calcification superior aspect of the fundus of the uterus of
questionable significance. No worrisome adnexal process identified.
No obvious bladder lesion.

Fatty containing periumbilical hernia.  No bowel containing hernia.
IMPRESSION: Scattered colonic diverticula most notable sigmoid colon without
extra luminal bowel inflammatory process, free fluid or free air.
Specifically, no inflammation surrounds the terminal ileum,
diverticula or appendix.

Renal pyramids appear slightly dense raising possibility of
medullary sponge kidney. No renal or ureteral obstructing stone or
findings to suggest hydronephrosis.

Fatty liver.

Please see above.

## 2016-01-27 ENCOUNTER — Ambulatory Visit (INDEPENDENT_AMBULATORY_CARE_PROVIDER_SITE_OTHER): Payer: BLUE CROSS/BLUE SHIELD | Admitting: Sports Medicine

## 2016-01-27 ENCOUNTER — Ambulatory Visit (INDEPENDENT_AMBULATORY_CARE_PROVIDER_SITE_OTHER): Payer: BLUE CROSS/BLUE SHIELD

## 2016-01-27 ENCOUNTER — Encounter: Payer: Self-pay | Admitting: Sports Medicine

## 2016-01-27 DIAGNOSIS — M722 Plantar fascial fibromatosis: Secondary | ICD-10-CM | POA: Diagnosis not present

## 2016-01-27 DIAGNOSIS — M79671 Pain in right foot: Secondary | ICD-10-CM | POA: Diagnosis not present

## 2016-01-27 DIAGNOSIS — E079 Disorder of thyroid, unspecified: Secondary | ICD-10-CM | POA: Insufficient documentation

## 2016-01-27 MED ORDER — METHYLPREDNISOLONE 4 MG PO TBPK: ORAL_TABLET | ORAL | Status: DC

## 2016-01-27 MED ORDER — TRIAMCINOLONE ACETONIDE 10 MG/ML IJ SUSP: 10.0000 mg | Freq: Once | INTRAMUSCULAR | Status: AC

## 2016-01-27 MED ORDER — DICLOFENAC SODIUM 75 MG PO TBEC: 75.0000 mg | DELAYED_RELEASE_TABLET | Freq: Two times a day (BID) | ORAL | Status: DC

## 2016-01-27 NOTE — Progress Notes (Signed)
Patient ID: Dana Douglas, female   DOB: June 16, 1971, 45 y.o.   MRN: [id number]   Subjective: Dana Douglas is a 45 y.o. female patient presents to office with complaint of heel pain on the right; reports that pain radiates up the leg and to arch. Patient admits to post static dyskinesia for 5 months. Patient has treated this problem with stretching, icing, night splint. Patient denies any other pedal complaints.   Patient Active Problem List   Diagnosis Date Noted  . Disease of thyroid gland 01/27/2016  . Post-operative nausea and vomiting 11/18/2015  . Cervical os stenosis 08/24/2015  . Deep dyspareunia 08/24/2015  . Dysmenorrhea 08/24/2015  . Fibroids, submucosal 08/24/2015  . Abnormal LFTs 09/20/2014  . Referred by specialist physician 09/20/2014    Current Outpatient Prescriptions on File Prior to Visit  Medication Sig Dispense Refill  . nitrofurantoin, macrocrystal-monohydrate, (MACROBID) 100 MG capsule Take 1 capsule (100 mg total) by mouth 2 (two) times daily. 14 capsule 0  . phenazopyridine (PYRIDIUM) 200 MG tablet Take 1 tablet (200 mg total) by mouth 3 (three) times daily. 6 tablet 0  . thyroid (ARMOUR) 30 MG tablet Take 30 mg by mouth daily before breakfast.    . [DISCONTINUED] promethazine (PHENERGAN) 25 MG tablet Take 1 tablet (25 mg total) by mouth every 8 (eight) hours as needed for nausea or vomiting. 20 tablet 0   No current facility-administered medications on file prior to visit.    Allergies  Allergen Reactions  . Codeine Nausea And Vomiting    Objective: Physical Exam General: The patient is alert and oriented x3 in no acute distress.  Dermatology: Skin is warm, dry and supple bilateral lower extremities. Nails 1-10 are normal. There is no erythema, edema, no eccymosis, no open lesions present. Integument is otherwise unremarkable.  Vascular: Dorsalis Pedis pulse and Posterior Tibial pulse are 2/4 bilateral. Capillary fill time is immediate to all  digits.  Neurological: Grossly intact to light touch with an achilles reflex of +2/5 and a  negative Tinel's sign bilateral.  Musculoskeletal: Tenderness to palpation at the medial calcaneal tubercale and through the insertion of the plantar fascia on the right foot. No pain with compression of calcaneus bilateral. No pain with tuning fork to calcaneus bilateral. No pain with calf compression bilateral. There is decreased Ankle joint range of motion bilateral. All other joints range of motion within normal limits bilateral. Strength 5/5 in all groups bilateral.   Gait: Unassisted, Antalgic avoid weight on right heel  Xray, Right foot:  Normal osseous mineralization. Joint spaces preserved except at midfoot suggestive of arthritis, mild hammertoe, No fracture/dislocation/boney destruction. Calcaneal spur present with mild thickening of plantar fascia. No other soft tissue abnormalities or radiopaque foreign bodies.   Assessment and Plan: Problem List Items Addressed This Visit    None    Visit Diagnoses    Heel pain, right    -  Primary    Relevant Medications    triamcinolone acetonide (KENALOG) 10 MG/ML injection 10 mg    methylPREDNISolone (MEDROL DOSEPAK) 4 MG TBPK tablet    diclofenac (VOLTAREN) 75 MG EC tablet    Other Relevant Orders    DG Foot 2 Views Right    Plantar fasciitis, right        Relevant Medications    triamcinolone acetonide (KENALOG) 10 MG/ML injection 10 mg    methylPREDNISolone (MEDROL DOSEPAK) 4 MG TBPK tablet    diclofenac (VOLTAREN) 75 MG EC tablet      -  Complete examination performed. Discussed with patient in detail the condition of plantar fasciitis, how this occurs and general treatment options. Explained both conservative and surgical treatments.  -After oral consent and aseptic prep, injected a mixture containing 1 ml of 2%  plain lidocaine, 1 ml 0.5% plain marcaine, 0.5 ml of kenalog 10 and 0.5 ml of dexamethasone phosphate into right heel.  Post-injection care discussed with patient.  -Rx Diclofenac to start after Medrol dose pack -Recommended good supportive shoes and advised use of OTC insert. Explained to patient that if these orthoses work well, we will continue with these. If these do not improve her condition and  pain, we will consider custom molded orthoses. -Explained in detail the use of the fascial brace which was dispensed at today's visit. -Explained and dispensed to patient daily stretching exercises. -Recommend patient to ice affected area 1-2x daily. -Patient to return to office in 3 weeks for follow up or sooner if problems or questions arise.  Dana Douglas, DPM

## 2016-01-27 NOTE — Patient Instructions (Signed)

## 2016-02-07 ENCOUNTER — Ambulatory Visit: Payer: Self-pay | Admitting: Podiatry

## 2016-02-12 ENCOUNTER — Encounter: Payer: Self-pay | Admitting: Gynecology

## 2016-02-12 ENCOUNTER — Ambulatory Visit
Admission: EM | Admit: 2016-02-12 | Discharge: 2016-02-12 | Disposition: A | Discharge status: Home / Self Care | Payer: BLUE CROSS/BLUE SHIELD | Attending: Family Medicine | Admitting: Family Medicine

## 2016-02-12 DIAGNOSIS — N390 Urinary tract infection, site not specified: Secondary | ICD-10-CM | POA: Diagnosis not present

## 2016-02-12 LAB — URINALYSIS COMPLETE WITH MICROSCOPIC (ARMC ONLY)
Glucose, UA: 100 mg/dL — AB
LEUKOCYTES UA: NEGATIVE
Nitrite: POSITIVE — AB
PH: 5 (ref 5.0–8.0)
PROTEIN: 100 mg/dL — AB
Specific Gravity, Urine: 1.02 (ref 1.005–1.030)

## 2016-02-12 MED ORDER — CEPHALEXIN 500 MG PO CAPS: 500.0000 mg | ORAL_CAPSULE | Freq: Two times a day (BID) | ORAL | Status: DC

## 2016-02-12 NOTE — ED Provider Notes (Addendum)
CSN: [id number]     Arrival date & time 02/12/16  0808 History   First MD Initiated Contact with Patient 02/12/16 0831     Chief Complaint  Patient presents with  . Urinary Tract Infection   (Consider location/radiation/quality/duration/timing/severity/associated sxs/prior Treatment) HPI  45 year old female presents to urgent care with concerns that she has a UTI.  Patient reports that she developed dysuria, urinary frequency, and urgency last night. Symptoms are severe. Bi associated fevers or chills. No flank pain. She does report she has a history of kidney stones but has not had one in several years. She has taken Azo with no improvement. No known exacerbating factors. She is concerned as she is going out of town [employer].   Past Medical History  Diagnosis Date  . Thyroid disease    Past Surgical History  Procedure Laterality Date  . Cesarean section    . Foot surgery Left   . Abdominal hysterectomy  11/2015   Family History  Problem Relation Age of Onset  . Hypertension Father    Social History  Substance Use Topics  . Smoking status: Never Smoker   . Smokeless tobacco: None  . Alcohol Use: No   OB History    No data available     Review of Systems  Constitutional: Negative for fever.  Genitourinary: Positive for dysuria, urgency and frequency.   Allergies  Codeine  Home Medications   Prior to Admission medications   Medication Sig Start Date End Date Taking? Authorizing Provider  diclofenac (VOLTAREN) 75 MG EC tablet Take 1 tablet (75 mg total) by mouth 2 (two) times daily. 01/27/16  Yes [name], DPM  thyroid (ARMOUR) 30 MG tablet Take 30 mg by mouth daily before breakfast.   Yes Historical Provider, MD  cephALEXin (KEFLEX) 500 MG capsule Take 1 capsule (500 mg total) by mouth 2 (two) times daily. 02/12/16   Coral Spikes, DO  methylPREDNISolone (MEDROL DOSEPAK) 4 MG TBPK tablet Take as instructed 01/27/16   [name], DPM  nitrofurantoin,  macrocrystal-monohydrate, (MACROBID) 100 MG capsule Take 1 capsule (100 mg total) by mouth 2 (two) times daily. 04/25/15   Olen Cordial, NP  phenazopyridine (PYRIDIUM) 200 MG tablet Take 1 tablet (200 mg total) by mouth 3 (three) times daily. 04/25/15   Olen Cordial, NP   Meds Ordered and Administered this Visit  Medications - No data to display  BP 132/83 mmHg  Pulse 90  Temp(Src) 98 F (36.7 C) (Oral)  Resp 16  Ht 5\' 4"  (1.626 m)  Wt 230 lb (104.327 kg)  BMI 39.46 kg/m2  SpO2 100%  LMP  (Within Days) No data found.  Physical Exam  Constitutional: She is oriented to person, place, and time. She appears well-developed. No distress.  Cardiovascular: Normal rate and regular rhythm.   No murmur heard. Pulmonary/Chest: Effort normal. She has no wheezes. She has no rales.  Abdominal: Soft. She exhibits no distension. There is no tenderness. There is no rebound and no guarding.  No CVA tenderness.  Neurological: She is alert and oriented to person, place, and time.  Psychiatric: She has a normal mood and affect.  Vitals reviewed.  ED Course  Procedures (including critical care time)  Labs Review Labs Reviewed  URINE CULTURE  URINALYSIS COMPLETEWITH MICROSCOPIC ([place])   MDM   1. UTI (lower urinary tract infection)    New acute problem. History and urinalysis consistent with UTI. Obtaining culture. Treating empirically with Keflex.   Barnie Del  [place], [place] 02/12/16 [phone]

## 2016-02-12 NOTE — ED Notes (Signed)
Patient c/o painful urination / urge to void x yesterday.

## 2016-02-12 NOTE — Discharge Instructions (Signed)
You have findings consistent with UTI.  I have prescribed Keflex. Take as prescribed.  Follow up if you fail to improve or worsen.  Take care  Dr. Lacinda Axon   Urinary Tract Infection Urinary tract infections (UTIs) can develop anywhere along your urinary tract. Your urinary tract is your body's drainage system for removing wastes and extra water. Your urinary tract includes two kidneys, two ureters, a bladder, and a urethra. Your kidneys are a pair of bean-shaped organs. Each kidney is about the size of your fist. They are located below your ribs, one on each side of your spine. CAUSES Infections are caused by microbes, which are microscopic organisms, including fungi, viruses, and bacteria. These organisms are so small that they can only be seen through a microscope. Bacteria are the microbes that most commonly cause UTIs. SYMPTOMS  Symptoms of UTIs may vary by age and gender of the patient and by the location of the infection. Symptoms in young women typically include a frequent and intense urge to urinate and a painful, burning feeling in the bladder or urethra during urination. Older women and men are more likely to be tired, shaky, and weak and have muscle aches and abdominal pain. A fever may mean the infection is in your kidneys. Other symptoms of a kidney infection include pain in your back or sides below the ribs, nausea, and vomiting. DIAGNOSIS To diagnose a UTI, your caregiver will ask you about your symptoms. Your caregiver will also ask you to provide a urine sample. The urine sample will be tested for bacteria and white blood cells. White blood cells are made by your body to help fight infection. TREATMENT  Typically, UTIs can be treated with medication. Because most UTIs are caused by a bacterial infection, they usually can be treated with the use of antibiotics. The choice of antibiotic and length of treatment depend on your symptoms and the type of bacteria causing your  infection. HOME CARE INSTRUCTIONS  If you were prescribed antibiotics, take them exactly as your caregiver instructs you. Finish the medication even if you feel better after you have only taken some of the medication.  Drink enough water and fluids to keep your urine clear or pale yellow.  Avoid caffeine, tea, and carbonated beverages. They tend to irritate your bladder.  Empty your bladder often. Avoid holding urine for long periods of time.  Empty your bladder before and after sexual intercourse.  After a bowel movement, women should cleanse from front to back. Use each tissue only once. SEEK MEDICAL CARE IF:   You have back pain.  You develop a fever.  Your symptoms do not begin to resolve within 3 days. SEEK IMMEDIATE MEDICAL CARE IF:   You have severe back pain or lower abdominal pain.  You develop chills.  You have nausea or vomiting.  You have continued burning or discomfort with urination. MAKE SURE YOU:   Understand these instructions.  Will watch your condition.  Will get help right away if you are not doing well or get worse.   This information is not intended to replace advice given to you by your health care provider. Make sure you discuss any questions you have with your health care provider.   Document Released: 07/04/2005 Document Revised: 06/15/2015 Document Reviewed: 11/02/2011 Elsevier Interactive Patient Education [employer].

## 2016-02-14 LAB — URINE CULTURE

## 2016-02-24 ENCOUNTER — Ambulatory Visit: Payer: BLUE CROSS/BLUE SHIELD | Admitting: Sports Medicine

## 2016-03-06 ENCOUNTER — Ambulatory Visit: Payer: BLUE CROSS/BLUE SHIELD | Admitting: Sports Medicine

## 2016-03-13 ENCOUNTER — Ambulatory Visit (INDEPENDENT_AMBULATORY_CARE_PROVIDER_SITE_OTHER): Payer: BLUE CROSS/BLUE SHIELD | Admitting: Sports Medicine

## 2016-03-13 ENCOUNTER — Encounter: Payer: Self-pay | Admitting: Sports Medicine

## 2016-03-13 DIAGNOSIS — M79671 Pain in right foot: Secondary | ICD-10-CM | POA: Diagnosis not present

## 2016-03-13 DIAGNOSIS — M722 Plantar fascial fibromatosis: Secondary | ICD-10-CM | POA: Diagnosis not present

## 2016-03-13 MED ORDER — TRIAMCINOLONE ACETONIDE 10 MG/ML IJ SUSP: 10.0000 mg | Freq: Once | INTRAMUSCULAR | Status: AC

## 2016-03-13 MED ORDER — DICLOFENAC SODIUM 75 MG PO TBEC: 75.0000 mg | DELAYED_RELEASE_TABLET | Freq: Two times a day (BID) | ORAL | Status: AC

## 2016-03-13 NOTE — Progress Notes (Signed)
Patient ID: Nekoda Lehane, female   DOB: 08/20/1971, 45 y.o.   MRN: [id number] Subjective: Lache Saman is a 45 y.o. female returns to office for follow up evaluation after Right heel injection #1 for plantar fasciitis. Reports pain is 50% better and has decreased in frequency to the area. Patient denies any recent changes in medications or new problems since last visit. Tolerated diclofenac and steroid dose pack fine without problems.  Patient Active Problem List   Diagnosis Date Noted  . Disease of thyroid gland 01/27/2016  . Cervical os stenosis 08/24/2015  . Deep dyspareunia 08/24/2015  . Dysmenorrhea 08/24/2015  . Fibroids, submucosal 08/24/2015  . Abnormal LFTs 09/20/2014  . Referred by specialist physician 09/20/2014    Current Outpatient Prescriptions on File Prior to Visit  Medication Sig Dispense Refill  . cephALEXin (KEFLEX) 500 MG capsule Take 1 capsule (500 mg total) by mouth 2 (two) times daily. 10 capsule 0  . methylPREDNISolone (MEDROL DOSEPAK) 4 MG TBPK tablet Take as instructed 21 tablet 0  . nitrofurantoin, macrocrystal-monohydrate, (MACROBID) 100 MG capsule Take 1 capsule (100 mg total) by mouth 2 (two) times daily. 14 capsule 0  . phenazopyridine (PYRIDIUM) 200 MG tablet Take 1 tablet (200 mg total) by mouth 3 (three) times daily. 6 tablet 0  . thyroid (ARMOUR) 30 MG tablet Take 30 mg by mouth daily before breakfast.    . [DISCONTINUED] promethazine (PHENERGAN) 25 MG tablet Take 1 tablet (25 mg total) by mouth every 8 (eight) hours as needed for nausea or vomiting. 20 tablet 0   Current Facility-Administered Medications on File Prior to Visit  Medication Dose Route Frequency Provider Last Rate Last Dose  . triamcinolone acetonide (KENALOG) 10 MG/ML injection 10 mg  10 mg Other Once [name], DPM        Allergies  Allergen Reactions  . Codeine Nausea And Vomiting    Objective:   General:  Alert and oriented x 3, in no acute  distress  Dermatology: Skin is warm, dry, and supple bilateral. Nails are within normal limits. There is no lower extremity erythema, no eccymosis, no open lesions present bilateral.   Vascular: Dorsalis Pedis and Posterior Tibial pedal pulses are 2/4 bilateral. + hair growth noted bilateral. Capillary Fill Time is 3 seconds in all digits. No varicosities, No edema bilateral lower extremities.   Neurological: Sensation grossly intact to light touch with an achilles reflex of +2/5 and a  negative Tinel's sign bilateral. Vibratory, sharp/dull, Semmes Weinstein Monofilament within normal limits.   Musculoskeletal: There is decreased tenderness to palpation at the medial calcaneal tubercale and through the insertion of the plantar fascia on the right foot. No pain with compression to calcaneus or application of tuning fork. There is decreased Ankle joint range of motion bilateral. All other jointsrange of motion  within normal limits bilateral. Strength 5/5 bilateral.   Assessment and Plan: Problem List Items Addressed This Visit    None    Visit Diagnoses    Heel pain, right    -  Primary    Relevant Medications    diclofenac (VOLTAREN) 75 MG EC tablet    triamcinolone acetonide (KENALOG) 10 MG/ML injection 10 mg    Plantar fasciitis, right        Relevant Medications    diclofenac (VOLTAREN) 75 MG EC tablet    triamcinolone acetonide (KENALOG) 10 MG/ML injection 10 mg      -Complete examination performed.  -Previous x-rays reviewed. -Discussed with patient in detail  the condition of plantar fasciitis, how this  occurs related to the foot type of the patient and general treatment options. - Patient opted for another injection today; After oral consent and aseptic prep, injected a mixture containing 0.5 ml of 1%plain lidocaine, 0.5 ml 0.25% plain marcaine, 0.5 ml of kenalog 10 and 0.5 ml of dexmethasone phosphate to right heel at area of most pain/trigger point injection. -Refilled  Diclofenac  -Continue with fascial brace, stretching, icing, good supportive shoes, inserts daily.  -Discussed long term care and reocurrence; will closely monitor; if fails to improve will consider other treatment modalities.  -Patient to return to office in 3-4 weeks for follow up or sooner if problems or questions arise.  [name], DPM

## 2016-04-06 ENCOUNTER — Encounter: Payer: Self-pay | Admitting: Emergency Medicine

## 2016-04-06 ENCOUNTER — Ambulatory Visit
Admission: EM | Admit: 2016-04-06 | Discharge: 2016-04-06 | Disposition: A | Discharge status: Other Acute Inpatient Hospital | Payer: BLUE CROSS/BLUE SHIELD | Attending: Family Medicine | Admitting: Family Medicine

## 2016-04-06 DIAGNOSIS — R1031 Right lower quadrant pain: Secondary | ICD-10-CM | POA: Diagnosis not present

## 2016-04-06 NOTE — ED Notes (Signed)
Patient c/o right sided abdominal pain that started a hour ago.  Patient reports nausea.

## 2016-04-06 NOTE — Discharge Instructions (Signed)
Go directly to emergency room as discussed.  ° °Abdominal Pain, Adult °Many things can cause abdominal pain. Usually, abdominal pain is not caused by a disease and will improve without treatment. It can often be observed and treated at home. Your health care provider will do a physical exam and possibly order blood tests and X-rays to help determine the seriousness of your pain. However, in many cases, more time must pass before a clear cause of the pain can be found. Before that point, your health care provider may not know if you need more testing or further treatment. °HOME CARE INSTRUCTIONS °Monitor your abdominal pain for any changes. The following actions may help to alleviate any discomfort you are experiencing: °· Only take over-the-counter or prescription medicines as directed by your health care provider. °· Do not take laxatives unless directed to do so by your health care provider. °· Try a clear liquid diet (broth, tea, or water) as directed by your health care provider. Slowly move to a bland diet as tolerated. °SEEK MEDICAL CARE IF: °· You have unexplained abdominal pain. °· You have abdominal pain associated with nausea or diarrhea. °· You have pain when you urinate or have a bowel movement. °· You experience abdominal pain that wakes you in the night. °· You have abdominal pain that is worsened or improved by eating food. °· You have abdominal pain that is worsened with eating fatty foods. °· You have a fever. °SEEK IMMEDIATE MEDICAL CARE IF: °· Your pain does not go away within 2 hours. °· You keep throwing up (vomiting). °· Your pain is felt only in portions of the abdomen, such as the right side or the left lower portion of the abdomen. °· You pass bloody or black tarry stools. °MAKE SURE YOU: °· Understand these instructions. °· Will watch your condition. °· Will get help right away if you are not doing well or get worse. °  °This information is not intended to replace advice given to you by  your health care provider. Make sure you discuss any questions you have with your health care provider. °  °Document Released: 07/04/2005 Document Revised: 06/15/2015 Document Reviewed: 06/03/2013 °Elsevier Interactive Patient Education ©2016 Elsevier Inc. ° °

## 2016-04-06 NOTE — ED Provider Notes (Signed)
[facility]  ____________________________________________  Time seen: Approximately 7:50 PM  I have reviewed the triage vital signs and the nursing notes.   HISTORY  Chief Complaint Abdominal Pain   HPI Dana Douglas is a 45 y.o. female patient presents for the complaint of right lower quadrant abdominal pain 1 hour. Patient reports accompanying nausea. Patient reports that she did happen to notice earlier today she had some low back pain is described as mild and atypical for her but states that the abdominal pain started when she got home this evening. Denies any trauma. Denies recent sickness. Denies any dysuria, vaginal complaints, rectal complaints, fever, vomiting or diarrhea. Denies blood in urine. Patient reports that she has had a partial hysterectomy. Patient states that pain is a constant right lower quadrant pain with intermittent increasing in severity. Patient denies any thing that she can do to increase or decrease the pain.  Patient reports that she does currently take diclofenac for right heel spur.  [name], MD PCP.   Past Medical History  Diagnosis Date  . Thyroid disease     Patient Active Problem List   Diagnosis Date Noted  . Disease of thyroid gland 01/27/2016  . Cervical os stenosis 08/24/2015  . Deep dyspareunia 08/24/2015  . Dysmenorrhea 08/24/2015  . Fibroids, submucosal 08/24/2015  . Abnormal LFTs 09/20/2014  . Referred by specialist physician 09/20/2014    Past Surgical History  Procedure Laterality Date  . Cesarean section    . Foot surgery Left   . Abdominal hysterectomy  11/2015    Current Outpatient Rx  Name  Route  Sig  Dispense  Refill  .           . diclofenac (VOLTAREN) 75 MG EC tablet   Oral   Take 1 tablet (75 mg total) by mouth 2 (two) times daily.   30 tablet   0   .           .           .           .             Allergies Codeine  Family History  Problem Relation Age of Onset  .  Hypertension Father     Social History Social History  Substance Use Topics  . Smoking status: Never Smoker   . Smokeless tobacco: None  . Alcohol Use: No    Review of Systems Constitutional: No fever/chills Eyes: No visual changes. ENT: No sore throat. Cardiovascular: Denies chest pain. Respiratory: Denies shortness of breath. Gastrointestinal:As above.  no vomiting.  No diarrhea.  No constipation. Genitourinary: Negative for dysuria. Musculoskeletal: Negative for back pain. Skin: Negative for rash. Neurological: Negative for headaches, focal weakness or numbness.  10-point ROS otherwise negative.  ____________________________________________   PHYSICAL EXAM:  VITAL SIGNS: ED Triage Vitals  Enc Vitals Group     BP 04/06/16 1932 156/90 mmHg     Pulse Rate 04/06/16 1932 92     Resp 04/06/16 1932 16     Temp 04/06/16 1932 97.9 F (36.6 C)     Temp Source 04/06/16 1932 Oral     SpO2 04/06/16 1932 100 %     Weight 04/06/16 1932 230 lb (104.327 kg)     Height 04/06/16 1932 5\' 4"  (1.626 m)     Head Cir --      Peak Flow --      Pain Score 04/06/16 1934 8  Pain Loc --      Pain Edu? --      Excl. in [place]? --     Constitutional: Alert and oriented. Well appearing and in no acute distress. Eyes: Conjunctivae are normal. PERRL. EOMI. Head: Atraumatic.  Ears:Normal external appearance bilaterally.  Nose: No congestion/rhinnorhea.  Mouth/Throat: Mucous membranes are moist.  Oropharynx non-erythematous. Neck: No stridor.  No cervical spine tenderness to palpation. Hematological/Lymphatic/Immunilogical: No cervical lymphadenopathy. Cardiovascular: Normal rate, regular rhythm. Grossly normal heart sounds.  Good peripheral circulation. Respiratory: Normal respiratory effort.  No retractions. Lungs CTAB. No wheezes, rales or rhonchi.  Gastrointestinal:Obese abdomen. Moderate tenderness to palpation right lower quadrant and McBurney's point. Positive Rovsing sign. Mild  obturator sign. Patient sitting with right hand supporting right abdomen and sitting very still. Musculoskeletal: No lower or upper extremity tenderness nor edema.  Neurologic:  Normal speech and language. No gross focal neurologic deficits are appreciated.  Skin:  Skin is warm, dry and intact. No rash noted. Psychiatric: Mood and affect are normal. Speech and behavior are normal.  ____________________________________________   LABS (all labs ordered are listed, but only abnormal results are displayed)  Labs Reviewed - No data to display ____________________________________________  INITIAL IMPRESSION / ASSESSMENT AND PLAN / ED COURSE  Pertinent labs & imaging results that were available during my care of the patient were reviewed by me and considered in my medical decision making (see chart for details).  Patient presents for complaint of acute onset of right lower quadrant pain. Patient point tender right lower quadrant at McBurney's point. Discussed in detail with patient concern for acute appendicitis. Patient does report nausea right now but declines offer for oral nausea medicines time. Patient husband to transport patient. Recommend patient be further evaluated emergency room for twist this time. Patient states that she will go to [facility]. Margarita Grizzle RN called and report given to. Patient stable at time of transfer and discharge.   Discussed follow up with Primary care physician this week. Discussed follow up and return parameters including no resolution or any worsening concerns. Patient verbalized understanding and agreed to plan.   ____________________________________________   FINAL CLINICAL IMPRESSION(S) / ED DIAGNOSES  Final diagnoses:  Right lower quadrant abdominal pain     Discharge Medication List as of 04/06/2016  7:52 PM      Note: This dictation was prepared with Dragon dictation along with smaller phrase technology. Any transcriptional errors that result  from this process are unintentional.       [name], NP 04/06/16 2019

## 2016-04-17 ENCOUNTER — Ambulatory Visit: Payer: BLUE CROSS/BLUE SHIELD | Admitting: Sports Medicine

## 2016-05-01 ENCOUNTER — Encounter: Payer: Self-pay | Admitting: Sports Medicine

## 2016-05-01 ENCOUNTER — Ambulatory Visit (INDEPENDENT_AMBULATORY_CARE_PROVIDER_SITE_OTHER): Payer: BLUE CROSS/BLUE SHIELD | Admitting: Sports Medicine

## 2016-05-01 DIAGNOSIS — M722 Plantar fascial fibromatosis: Secondary | ICD-10-CM

## 2016-05-01 DIAGNOSIS — M79671 Pain in right foot: Secondary | ICD-10-CM | POA: Diagnosis not present

## 2016-05-01 NOTE — Progress Notes (Signed)
Patient ID: Dana Douglas, female   DOB: 1971/09/30, 45 y.o.   MRN: [id number] Subjective: Dana Douglas is a 45 y.o. female returns to office for follow up evaluation after Right heel injection #2 for plantar fasciitis 6 weeks ago. Reports pain is 70% better and has decreased in frequency to the area; still some pain first few steps out of bed in the morning and will barefoot walking. Patient denies any recent changes in medications or new problems since last visit. Tolerated diclofenac and steroid dose pack fine without problems.  Patient Active Problem List   Diagnosis Date Noted  . Disease of thyroid gland 01/27/2016  . Cervical os stenosis 08/24/2015  . Deep dyspareunia 08/24/2015  . Dysmenorrhea 08/24/2015  . Fibroids, submucosal 08/24/2015  . Abnormal LFTs 09/20/2014  . Referred by specialist physician 09/20/2014    Current Outpatient Prescriptions on File Prior to Visit  Medication Sig Dispense Refill  . cephALEXin (KEFLEX) 500 MG capsule Take 1 capsule (500 mg total) by mouth 2 (two) times daily. 10 capsule 0  . diclofenac (VOLTAREN) 75 MG EC tablet Take 1 tablet (75 mg total) by mouth 2 (two) times daily. 30 tablet 0  . methylPREDNISolone (MEDROL DOSEPAK) 4 MG TBPK tablet Take as instructed 21 tablet 0  . nitrofurantoin, macrocrystal-monohydrate, (MACROBID) 100 MG capsule Take 1 capsule (100 mg total) by mouth 2 (two) times daily. 14 capsule 0  . phenazopyridine (PYRIDIUM) 200 MG tablet Take 1 tablet (200 mg total) by mouth 3 (three) times daily. 6 tablet 0  . thyroid (ARMOUR) 30 MG tablet Take 30 mg by mouth daily before breakfast.    . [DISCONTINUED] promethazine (PHENERGAN) 25 MG tablet Take 1 tablet (25 mg total) by mouth every 8 (eight) hours as needed for nausea or vomiting. 20 tablet 0   Current Facility-Administered Medications on File Prior to Visit  Medication Dose Route Frequency Provider Last Rate Last Dose  . triamcinolone acetonide (KENALOG) 10 MG/ML  injection 10 mg  10 mg Other Once [employer], DPM      . triamcinolone acetonide (KENALOG) 10 MG/ML injection 10 mg  10 mg Other Once [name], DPM        Allergies  Allergen Reactions  . Codeine Nausea And Vomiting    Objective:   General:  Alert and oriented x 3, in no acute distress  Dermatology: Skin is warm, dry, and supple bilateral. Nails are within normal limits. There is no lower extremity erythema, no eccymosis, no open lesions present bilateral.   Vascular: Dorsalis Pedis and Posterior Tibial pedal pulses are 2/4 bilateral. + hair growth noted bilateral. Capillary Fill Time is 3 seconds in all digits. No varicosities, No edema bilateral lower extremities.   Neurological: Sensation grossly intact to light touch with an achilles reflex of +2/5 and a  negative Tinel's sign bilateral. Vibratory, sharp/dull, Semmes Weinstein Monofilament within normal limits.   Musculoskeletal: There is decreased tenderness to palpation at the medial calcaneal tubercale and through the insertion of the plantar fascia on the right foot. No pain with compression to calcaneus or application of tuning fork. There is decreased Ankle joint range of motion bilateral. All other jointsrange of motion  within normal limits bilateral. Strength 5/5 bilateral.   Assessment and Plan: Problem List Items Addressed This Visit    None    Visit Diagnoses    Heel pain, right    -  Primary   Plantar fasciitis, right         -  Complete examination performed.  -Previous x-rays reviewed. -Dispensed night splint -Refilled Diclofenac  -Continue with fascial brace, stretching, icing, good supportive shoes, inserts daily.  -Discussed long term care and reocurrence; will closely monitor; if fails to improve will consider other treatment modalities.  -Patient to return to office in 3-4 weeks for follow up or sooner if problems or questions arise. May consider re-injection next visit if still bothersome.    [name], DPM

## 2016-05-29 ENCOUNTER — Ambulatory Visit: Payer: BLUE CROSS/BLUE SHIELD | Admitting: Sports Medicine

## 2017-03-05 ENCOUNTER — Ambulatory Visit (INDEPENDENT_AMBULATORY_CARE_PROVIDER_SITE_OTHER): Payer: BLUE CROSS/BLUE SHIELD

## 2017-03-05 ENCOUNTER — Ambulatory Visit (INDEPENDENT_AMBULATORY_CARE_PROVIDER_SITE_OTHER): Payer: BLUE CROSS/BLUE SHIELD | Admitting: Podiatry

## 2017-03-05 DIAGNOSIS — M778 Other enthesopathies, not elsewhere classified: Secondary | ICD-10-CM

## 2017-03-05 DIAGNOSIS — M779 Enthesopathy, unspecified: Principal | ICD-10-CM

## 2017-03-05 DIAGNOSIS — M7752 Other enthesopathy of left foot: Secondary | ICD-10-CM

## 2017-03-05 DIAGNOSIS — M79672 Pain in left foot: Secondary | ICD-10-CM

## 2017-03-05 MED ORDER — MELOXICAM 15 MG PO TABS: 15.0000 mg | ORAL_TABLET | Freq: Every day | ORAL | 3 refills | Status: DC

## 2017-03-05 NOTE — Progress Notes (Signed)
   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 Milinda Pointer in 2015 presenting today with a complaint of pain to the dorsum of the left foot that began approximately 6 months ago. She states she is going to [facility] next week and wants an injection.     Physical Exam: General: The patient is alert and oriented x3 in no acute distress.  Dermatology: Skin is warm, dry and supple bilateral lower extremities. Negative for open lesions or macerations.  Vascular: Palpable pedal pulses bilaterally. No edema or erythema noted. Capillary refill within normal limits.  Neurological: Epicritic and protective threshold grossly intact bilaterally.   Musculoskeletal Exam: Pain on palpation to the left midfoot. Range of motion within normal limits to all pedal and ankle joints bilateral. Muscle strength 5/5 in all groups bilateral.   Radiographic Exam:  Dorsal spurring of the mid tarsal bones noted on lateral view. Evidence of bunionectomy with intact wire fixation.  Assessment: 1. Left midfoot capsulitis    Plan of Care:  1. Patient was evaluated. X-rays reviewed.  2. Injection of 0.5 mLs Celestone Soluspan injected into the left midfoot. 3. Prescription for Meloxicam 15 mg given to patient. 4. Return to clinic when necessary.    Edrick Kins, DPM [facility]  Dr. Edrick Kins, [place]                                        [place], [place] [address]                Office [phone]  Fax [phone]

## 2017-03-12 MED ORDER — BETAMETHASONE SOD PHOS & ACET 6 (3-3) MG/ML IJ SUSP: 3.0000 mg | Freq: Once | INTRAMUSCULAR | Status: AC

## 2017-04-03 DIAGNOSIS — D123 Benign neoplasm of transverse colon: Secondary | ICD-10-CM | POA: Insufficient documentation

## 2017-09-06 ENCOUNTER — Ambulatory Visit: Payer: BLUE CROSS/BLUE SHIELD | Admitting: Podiatry

## 2017-09-22 ENCOUNTER — Other Ambulatory Visit: Payer: Self-pay | Admitting: Podiatry

## 2018-09-12 ENCOUNTER — Ambulatory Visit
Admission: EM | Admit: 2018-09-12 | Discharge: 2018-09-12 | Disposition: A | Discharge status: Home / Self Care | Payer: BLUE CROSS/BLUE SHIELD | Attending: Family Medicine | Admitting: Family Medicine

## 2018-09-12 ENCOUNTER — Encounter: Payer: Self-pay | Admitting: Emergency Medicine

## 2018-09-12 ENCOUNTER — Other Ambulatory Visit: Payer: Self-pay

## 2018-09-12 DIAGNOSIS — N390 Urinary tract infection, site not specified: Secondary | ICD-10-CM

## 2018-09-12 DIAGNOSIS — B9689 Other specified bacterial agents as the cause of diseases classified elsewhere: Secondary | ICD-10-CM | POA: Diagnosis not present

## 2018-09-12 LAB — URINALYSIS, COMPLETE (UACMP) WITH MICROSCOPIC

## 2018-09-12 MED ORDER — CEPHALEXIN 500 MG PO CAPS: 500.0000 mg | ORAL_CAPSULE | Freq: Two times a day (BID) | ORAL | 0 refills | Status: DC

## 2018-09-12 NOTE — ED Provider Notes (Signed)
[facility]    CSN: [id number] Arrival date & time: 09/12/18  1558     History   Chief Complaint Chief Complaint  Patient presents with  . Urinary Frequency    HPI Dana Douglas is a 47 y.o. female.   The history is provided by the patient.  Dysuria  Pain quality:  Aching Pain severity:  Mild Onset quality:  Sudden Duration:  6 days Timing:  Constant Progression:  Worsening Chronicity:  New Recent urinary tract infections: no   Relieved by:  Nothing Ineffective treatments:  Phenazopyridine Urinary symptoms: frequent urination   Urinary symptoms comment:  Urgency Risk factors: no hx of pyelonephritis, no hx of urolithiasis, no kidney transplant, not pregnant, no recurrent urinary tract infections, no renal cysts, no renal disease, no single kidney and no urinary catheter     Past Medical History:  Diagnosis Date  . Thyroid disease     Patient Active Problem List   Diagnosis Date Noted  . Disease of thyroid gland 01/27/2016  . Cervical os stenosis 08/24/2015  . Deep dyspareunia 08/24/2015  . Dysmenorrhea 08/24/2015  . Fibroids, submucosal 08/24/2015  . Abnormal LFTs 09/20/2014  . Referred by specialist physician 09/20/2014    Past Surgical History:  Procedure Laterality Date  . ABDOMINAL HYSTERECTOMY  11/2015  . CESAREAN SECTION    . FOOT SURGERY Left     OB History   None      Home Medications    Prior to Admission medications   Medication Sig Start Date End Date Taking? Authorizing Provider  thyroid (ARMOUR) 30 MG tablet Take 30 mg by mouth daily before breakfast.   Yes [provider]  cephALEXin (KEFLEX) 500 MG capsule Take 1 capsule (500 mg total) by mouth 2 (two) times daily. 09/12/18   Norval Gable, MD  diclofenac (VOLTAREN) 75 MG EC tablet Take 1 tablet (75 mg total) by mouth 2 (two) times daily. 03/13/16   [name], DPM  meloxicam (MOBIC) 15 MG tablet Take 1 tablet (15 mg total) by mouth daily. 03/05/17    Edrick Kins, DPM  methylPREDNISolone (MEDROL DOSEPAK) 4 MG TBPK tablet Take as instructed 01/27/16   [name], DPM  nitrofurantoin, macrocrystal-monohydrate, (MACROBID) 100 MG capsule Take 1 capsule (100 mg total) by mouth 2 (two) times daily. 04/25/15   Betancourt, Aura Fey, NP  phenazopyridine (PYRIDIUM) 200 MG tablet Take 1 tablet (200 mg total) by mouth 3 (three) times daily. 04/25/15   Betancourt, Aura Fey, NP    Family History Family History  Problem Relation Age of Onset  . Hypertension Father     Social History Social History   Tobacco Use  . Smoking status: Never Smoker  . Smokeless tobacco: Never Used  Substance Use Topics  . Alcohol use: No  . Drug use: No     Allergies   Codeine   Review of Systems Review of Systems  Genitourinary: Positive for dysuria.     Physical Exam Triage Vital Signs ED Triage Vitals  Enc Vitals Group     BP 09/12/18 1607 (!) 169/89     Pulse Rate 09/12/18 1607 (!) 106     Resp 09/12/18 1607 14     Temp 09/12/18 1607 98 F (36.7 C)     Temp Source 09/12/18 1607 Oral     SpO2 09/12/18 1607 99 %     Weight 09/12/18 1606 225 lb (102.1 kg)     Height 09/12/18 1606 5\' 4"  (1.626 m)  Head Circumference --      Peak Flow --      Pain Score 09/12/18 1605 5     Pain Loc --      Pain Edu? --      Excl. in [place]? --    No data found.  Updated Vital Signs BP (!) 169/89 (BP Location: Left Arm)   Pulse (!) 106   Temp 98 F (36.7 C) (Oral)   Resp 14   Ht 5\' 4"  (1.626 m)   Wt 102.1 kg   LMP  (LMP Unknown)   SpO2 99%   BMI 38.62 kg/m   Visual Acuity Right Eye Distance:   Left Eye Distance:   Bilateral Distance:    Right Eye Near:   Left Eye Near:    Bilateral Near:     Physical Exam  Constitutional: She appears well-developed and well-nourished. No distress.  Abdominal: Soft. Bowel sounds are normal. She exhibits no distension and no mass. There is no tenderness. There is no rebound and no guarding.  Skin: She is not  diaphoretic.  Nursing note and vitals reviewed.    UC Treatments / Results  Labs (all labs ordered are listed, but only abnormal results are displayed) Labs Reviewed  URINALYSIS, COMPLETE (UACMP) WITH MICROSCOPIC - Abnormal; Notable for the following components:      Result Value   Color, Urine LESS THAN 10 mL OF URINE SUBMITTED (*)    Glucose, UA   (*)    Value: TEST NOT REPORTED DUE TO COLOR INTERFERENCE OF URINE PIGMENT   Hgb urine dipstick   (*)    Value: TEST NOT REPORTED DUE TO COLOR INTERFERENCE OF URINE PIGMENT   Bilirubin Urine   (*)    Value: TEST NOT REPORTED DUE TO COLOR INTERFERENCE OF URINE PIGMENT   Ketones, ur   (*)    Value: TEST NOT REPORTED DUE TO COLOR INTERFERENCE OF URINE PIGMENT   Protein, ur   (*)    Value: TEST NOT REPORTED DUE TO COLOR INTERFERENCE OF URINE PIGMENT   Nitrite   (*)    Value: TEST NOT REPORTED DUE TO COLOR INTERFERENCE OF URINE PIGMENT   Leukocytes, UA   (*)    Value: TEST NOT REPORTED DUE TO COLOR INTERFERENCE OF URINE PIGMENT   Bacteria, UA FEW (*)    All other components within normal limits  URINE CULTURE    EKG None  Radiology No results found.  Procedures Procedures (including critical care time)  Medications Ordered in UC Medications - No data to display  Initial Impression / Assessment and Plan / UC Course  I have reviewed the triage vital signs and the nursing notes.  Pertinent labs & imaging results that were available during my care of the patient were reviewed by me and considered in my medical decision making (see chart for details).      Final Clinical Impressions(s) / UC Diagnoses   Final diagnoses:  Urinary tract infection without hematuria, site unspecified    ED Prescriptions    Medication Sig Dispense Auth. Provider   cephALEXin (KEFLEX) 500 MG capsule Take 1 capsule (500 mg total) by mouth 2 (two) times daily. 14 capsule Norval Gable, MD     1. Lab results and diagnosis reviewed with  patient 2. rx as per orders above; reviewed possible side effects, interactions, risks and benefits  3. Recommend supportive treatment with increased water intake 4. Follow-up prn if symptoms worsen or don't improve    Controlled  Substance Prescriptions [place] Controlled Substance Registry consulted? Not Applicable   Norval Gable, MD 09/12/18 [phone]

## 2018-09-12 NOTE — ED Triage Notes (Signed)
Patient c/o ongoing urinary urgency and frequency that started last weekend.  Patient denies burning when urinating.

## 2018-09-14 LAB — URINE CULTURE
Culture: >=100000 — AB
Special Requests: NORMAL

## 2019-10-06 ENCOUNTER — Ambulatory Visit: Payer: BC Managed Care – PPO | Attending: Internal Medicine

## 2019-10-06 DIAGNOSIS — Z20822 Contact with and (suspected) exposure to covid-19: Secondary | ICD-10-CM

## 2019-10-07 LAB — NOVEL CORONAVIRUS, NAA: SARS-CoV-2, NAA: NOT DETECTED

## 2020-04-06 DIAGNOSIS — M159 Polyosteoarthritis, unspecified: Secondary | ICD-10-CM | POA: Insufficient documentation

## 2020-04-24 ENCOUNTER — Other Ambulatory Visit: Payer: Self-pay

## 2020-04-24 ENCOUNTER — Encounter: Payer: Self-pay | Admitting: Emergency Medicine

## 2020-04-24 ENCOUNTER — Ambulatory Visit
Admission: EM | Admit: 2020-04-24 | Discharge: 2020-04-24 | Disposition: A | Discharge status: Home / Self Care | Payer: BC Managed Care – PPO | Attending: Family Medicine | Admitting: Family Medicine

## 2020-04-24 DIAGNOSIS — R300 Dysuria: Secondary | ICD-10-CM | POA: Diagnosis present

## 2020-04-24 DIAGNOSIS — N3000 Acute cystitis without hematuria: Secondary | ICD-10-CM | POA: Diagnosis not present

## 2020-04-24 LAB — URINALYSIS, COMPLETE (UACMP) WITH MICROSCOPIC
Squamous Epithelial / HPF: NONE SEEN (ref 0–5)
WBC, UA: NONE SEEN WBC/hpf (ref 0–5)

## 2020-04-24 MED ORDER — CEPHALEXIN 500 MG PO CAPS: 500.0000 mg | ORAL_CAPSULE | Freq: Two times a day (BID) | ORAL | 0 refills | Status: DC

## 2020-04-24 NOTE — ED Provider Notes (Signed)
[facility]    CSN: [id number] Arrival date & time: 04/24/20  0881      History   Chief Complaint Chief Complaint  Patient presents with   Abdominal Pain   Urinary Frequency    HPI Dana Douglas is a 49 y.o. female.   49 yo female with a c/o lower abdominal cramping, bladder pressure sensation, burning with urination and frequent urination since yesterday. Denies any fevers, chills, flank pain, nausea, vomiting. Has been taking AZO since yesterday.    Abdominal Pain Urinary Frequency Associated symptoms include abdominal pain.    Past Medical History:  Diagnosis Date   Thyroid disease     Patient Active Problem List   Diagnosis Date Noted   Disease of thyroid gland 01/27/2016   Cervical os stenosis 08/24/2015   Deep dyspareunia 08/24/2015   Dysmenorrhea 08/24/2015   Fibroids, submucosal 08/24/2015   Abnormal LFTs 09/20/2014   Referred by specialist physician 09/20/2014    Past Surgical History:  Procedure Laterality Date   ABDOMINAL HYSTERECTOMY  11/2015   CESAREAN SECTION     FOOT SURGERY Left     OB History   No obstetric history on file.      Home Medications    Prior to Admission medications   Medication Sig Start Date End Date Taking? Authorizing Provider  NP THYROID PO Take 75 mcg by mouth.   Yes [provider]  cephALEXin (KEFLEX) 500 MG capsule Take 1 capsule (500 mg total) by mouth 2 (two) times daily. 04/24/20   Norval Gable, MD  diclofenac (VOLTAREN) 75 MG EC tablet Take 1 tablet (75 mg total) by mouth 2 (two) times daily. 03/13/16   [name], DPM  meloxicam (MOBIC) 15 MG tablet Take 1 tablet (15 mg total) by mouth daily. 03/05/17   Edrick Kins, DPM  methylPREDNISolone (MEDROL DOSEPAK) 4 MG TBPK tablet Take as instructed 01/27/16   [name], DPM  nitrofurantoin, macrocrystal-monohydrate, (MACROBID) 100 MG capsule Take 1 capsule (100 mg total) by mouth 2 (two) times daily. 04/25/15    Betancourt, Aura Fey, NP  phenazopyridine (PYRIDIUM) 200 MG tablet Take 1 tablet (200 mg total) by mouth 3 (three) times daily. 04/25/15   Betancourt, Aura Fey, NP  thyroid (ARMOUR) 30 MG tablet Take 30 mg by mouth daily before breakfast.    [provider]    Family History Family History  Problem Relation Age of Onset   Hypertension Father     Social History Social History   Tobacco Use   Smoking status: Never Smoker   Smokeless tobacco: Never Used  Scientific laboratory technician Use: Never used  Substance Use Topics   Alcohol use: No   Drug use: No     Allergies   Codeine   Review of Systems Review of Systems  Gastrointestinal: Positive for abdominal pain.  Genitourinary: Positive for frequency.     Physical Exam Triage Vital Signs ED Triage Vitals  Enc Vitals Group     BP 04/24/20 0958 (!) 140/93     Pulse Rate 04/24/20 0958 85     Resp 04/24/20 0958 14     Temp 04/24/20 0958 98.3 F (36.8 C)     Temp Source 04/24/20 0958 Oral     SpO2 04/24/20 0958 99 %     Weight 04/24/20 0953 230 lb (104.3 kg)     Height 04/24/20 0953 5\' 4"  (1.626 m)     Head Circumference --  Peak Flow --      Pain Score 04/24/20 0953 4     Pain Loc --      Pain Edu? --      Excl. in [place]? --    No data found.  Updated Vital Signs BP (!) 140/93 (BP Location: Right Arm)    Pulse 85    Temp 98.3 F (36.8 C) (Oral)    Resp 14    Ht 5\' 4"  (1.626 m)    Wt 104.3 kg    LMP  (LMP Unknown)    SpO2 99%    BMI 39.48 kg/m   Visual Acuity Right Eye Distance:   Left Eye Distance:   Bilateral Distance:    Right Eye Near:   Left Eye Near:    Bilateral Near:     Physical Exam Vitals and nursing note reviewed.  Constitutional:      General: She is not in acute distress.    Appearance: She is not toxic-appearing or diaphoretic.  Abdominal:     General: Bowel sounds are normal. There is no distension.     Palpations: Abdomen is soft. There is no mass.     Tenderness: There is no  abdominal tenderness. There is no right CVA tenderness, left CVA tenderness, guarding or rebound.     Hernia: No hernia is present.  Neurological:     Mental Status: She is alert.      UC Treatments / Results  Labs (all labs ordered are listed, but only abnormal results are displayed) Labs Reviewed  URINALYSIS, COMPLETE (UACMP) WITH MICROSCOPIC - Abnormal; Notable for the following components:      Result Value   Color, Urine ORANGE (*)    APPearance HAZY (*)    Glucose, UA   (*)    Value: TEST NOT REPORTED DUE TO COLOR INTERFERENCE OF URINE PIGMENT   Hgb urine dipstick   (*)    Value: TEST NOT REPORTED DUE TO COLOR INTERFERENCE OF URINE PIGMENT   Bilirubin Urine   (*)    Value: TEST NOT REPORTED DUE TO COLOR INTERFERENCE OF URINE PIGMENT   Ketones, ur   (*)    Value: TEST NOT REPORTED DUE TO COLOR INTERFERENCE OF URINE PIGMENT   Protein, ur   (*)    Value: TEST NOT REPORTED DUE TO COLOR INTERFERENCE OF URINE PIGMENT   Nitrite   (*)    Value: TEST NOT REPORTED DUE TO COLOR INTERFERENCE OF URINE PIGMENT   Leukocytes,Ua   (*)    Value: TEST NOT REPORTED DUE TO COLOR INTERFERENCE OF URINE PIGMENT   Bacteria, UA RARE (*)    All other components within normal limits  URINE CULTURE    EKG   Radiology No results found.  Procedures Procedures (including critical care time)  Medications Ordered in UC Medications - No data to display  Initial Impression / Assessment and Plan / UC Course  I have reviewed the triage vital signs and the nursing notes.  Pertinent labs & imaging results that were available during my care of the patient were reviewed by me and considered in my medical decision making (see chart for details).      Final Clinical Impressions(s) / UC Diagnoses   Final diagnoses:  Dysuria  Acute cystitis without hematuria    ED Prescriptions    Medication Sig Dispense Auth. Provider   cephALEXin (KEFLEX) 500 MG capsule Take 1 capsule (500 mg total) by  mouth 2 (two) times daily. 14 capsule  Norval Gable, MD      1. Lab results and diagnosis reviewed with patient 2. rx as per orders above; reviewed possible side effects, interactions, risks and benefits  3. Recommend supportive treatment with increased water intake 4. Check urine culture 5. Follow-up prn if symptoms worsen or don't improve   PDMP not reviewed this encounter.   Norval Gable, MD 04/24/20 1121

## 2020-04-24 NOTE — ED Triage Notes (Signed)
Patient c/o lower abdominal cramping that started 2 days ago.  Patient states that yesterday she started having urinary frequency and bladder pressure.

## 2020-04-26 LAB — URINE CULTURE: Special Requests: NORMAL

## 2020-10-18 ENCOUNTER — Ambulatory Visit
Admission: EM | Admit: 2020-10-18 | Discharge: 2020-10-18 | Disposition: A | Discharge status: Home / Self Care | Payer: BC Managed Care – PPO | Attending: Emergency Medicine | Admitting: Emergency Medicine

## 2020-10-18 ENCOUNTER — Other Ambulatory Visit: Payer: Self-pay

## 2020-10-18 DIAGNOSIS — N390 Urinary tract infection, site not specified: Secondary | ICD-10-CM | POA: Insufficient documentation

## 2020-10-18 LAB — CBC WITH DIFFERENTIAL/PLATELET
Abs Immature Granulocytes: 0.05 10*3/uL (ref 0.00–0.07)
Basophils Absolute: 0.1 10*3/uL (ref 0.0–0.1)
Basophils Relative: 1 %
Eosinophils Absolute: 0.1 10*3/uL (ref 0.0–0.5)
Eosinophils Relative: 1 %
HCT: 38.5 % (ref 36.0–46.0)
Hemoglobin: 13.2 g/dL (ref 12.0–15.0)
Immature Granulocytes: 1 %
Lymphocytes Relative: 20 %
Lymphs Abs: 1.5 10*3/uL (ref 0.7–4.0)
MCH: 30.8 pg (ref 26.0–34.0)
MCHC: 34.3 g/dL (ref 30.0–36.0)
MCV: 89.7 fL (ref 80.0–100.0)
Monocytes Absolute: 1.1 10*3/uL — ABNORMAL HIGH (ref 0.1–1.0)
Monocytes Relative: 14 %
Neutro Abs: 4.9 10*3/uL (ref 1.7–7.7)
Neutrophils Relative %: 63 %
Platelets: 363 10*3/uL (ref 150–400)
RBC: 4.29 MIL/uL (ref 3.87–5.11)
RDW: 12.7 % (ref 11.5–15.5)
WBC: 7.7 10*3/uL (ref 4.0–10.5)
nRBC: 0 % (ref 0.0–0.2)

## 2020-10-18 LAB — COMPREHENSIVE METABOLIC PANEL
ALT: 30 U/L (ref 0–44)
AST: 25 U/L (ref 15–41)
Albumin: 3.6 g/dL (ref 3.5–5.0)
Alkaline Phosphatase: 67 U/L (ref 38–126)
Anion gap: 8 (ref 5–15)
BUN: 10 mg/dL (ref 6–20)
CO2: 32 mmol/L (ref 22–32)
Calcium: 9 mg/dL (ref 8.9–10.3)
Chloride: 99 mmol/L (ref 98–111)
Creatinine, Ser: 0.65 mg/dL (ref 0.44–1.00)
GFR, Estimated: >60 mL/min (ref >60)
Glucose, Bld: 109 mg/dL — ABNORMAL HIGH (ref 70–99)
Potassium: 3.1 mmol/L — ABNORMAL LOW (ref 3.5–5.1)
Sodium: 139 mmol/L (ref 135–145)
Total Bilirubin: 0.6 mg/dL (ref 0.3–1.2)
Total Protein: 8.2 g/dL — ABNORMAL HIGH (ref 6.5–8.1)

## 2020-10-18 LAB — URINALYSIS, COMPLETE (UACMP) WITH MICROSCOPIC
Bilirubin Urine: NEGATIVE
Glucose, UA: NEGATIVE mg/dL
Nitrite: POSITIVE — AB
Protein, ur: 30 mg/dL — AB
Specific Gravity, Urine: 1.02 (ref 1.005–1.030)
pH: 7 (ref 5.0–8.0)

## 2020-10-18 MED ORDER — CIPROFLOXACIN HCL 500 MG PO TABS: 500.0000 mg | ORAL_TABLET | Freq: Two times a day (BID) | ORAL | 0 refills | Status: DC

## 2020-10-18 NOTE — ED Triage Notes (Signed)
Patient presents to Urgent Care with complaints of chills, generalized body aches, fever last temp 98.0 this morning, fatigue since Friday. Pt reports symptom onset 01/05 and have not resolved. Had a negative covid test 01/07.Treating symptoms with ibuprofen and tylenol last dose at 0500.

## 2020-10-18 NOTE — ED Provider Notes (Signed)
[facility]    CSN: [id number] Arrival date & time: 10/18/20  6962      History   Chief Complaint Chief Complaint  Patient presents with  . Sore Throat  . Cough  . Fatigue    HPI Dana Douglas is a 50 y.o. female.   HPI   50 year old female here for evaluation of fever, chills, body aches, and fatigue that started 6 days ago.  Patient had a right ureteral stent placed on 95/28/4132 for complications related to a kidney stone.  Called and notified her urologist on 10/13/2020 and was advised to go to the ER for COVID/flu testing, blood cultures x2, and urinalysis.  Patient went to the ER and left after 2 hours without receiving the lab work.  Patient states that she did have an out patient COVID test on 1 8 that was negative for COVID but she was not checked for flu.  Patient's complaints also consist of a headache, back pain, nausea, decreased appetite, and cloudy urine.  Patient denies painful urination or blood in her urine, URI complaints, cough, shortness of breath, or wheezing.  Patient denies vomiting or diarrhea.  Past Medical History:  Diagnosis Date  . Thyroid disease     Patient Active Problem List   Diagnosis Date Noted  . Disease of thyroid gland 01/27/2016  . Cervical os stenosis 08/24/2015  . Deep dyspareunia 08/24/2015  . Dysmenorrhea 08/24/2015  . Fibroids, submucosal 08/24/2015  . Abnormal LFTs 09/20/2014  . Referred by specialist physician 09/20/2014    Past Surgical History:  Procedure Laterality Date  . ABDOMINAL HYSTERECTOMY  11/2015  . CESAREAN SECTION    . FOOT SURGERY Left   . UTERINE STENT PLACEMENT      OB History   No obstetric history on file.      Home Medications    Prior to Admission medications   Medication Sig Start Date End Date Taking? Authorizing Provider  ciprofloxacin (CIPRO) 500 MG tablet Take 1 tablet (500 mg total) by mouth 2 (two) times daily. 10/18/20  Yes Margarette Canada, NP  diclofenac (VOLTAREN) 75 MG  EC tablet Take 1 tablet (75 mg total) by mouth 2 (two) times daily. 03/13/16   [name], DPM  meloxicam (MOBIC) 15 MG tablet Take 1 tablet (15 mg total) by mouth daily. 03/05/17   Edrick Kins, DPM  methylPREDNISolone (MEDROL DOSEPAK) 4 MG TBPK tablet Take as instructed 01/27/16   [name], DPM  NP THYROID PO Take 75 mcg by mouth.    [provider]  thyroid (ARMOUR) 30 MG tablet Take 30 mg by mouth daily before breakfast.    [provider]    Family History Family History  Problem Relation Age of Onset  . Alzheimer's disease Mother   . Hypertension Father     Social History Social History   Tobacco Use  . Smoking status: Never Smoker  . Smokeless tobacco: Never Used  Vaping Use  . Vaping Use: Never used  Substance Use Topics  . Alcohol use: No  . Drug use: No     Allergies   Codeine   Review of Systems Review of Systems  Constitutional: Positive for appetite change, chills and fatigue. Negative for activity change.  HENT: Negative for congestion, ear pain and rhinorrhea.   Respiratory: Negative for cough and shortness of breath.   Gastrointestinal: Positive for nausea. Negative for diarrhea and vomiting.  Genitourinary: Negative for dysuria, flank pain, frequency, hematuria and urgency.  Musculoskeletal:  Positive for back pain.  Skin: Negative for rash.  Neurological: Positive for headaches.  Hematological: Negative.   Psychiatric/Behavioral: Negative.      Physical Exam Triage Vital Signs ED Triage Vitals  Enc Vitals Group     BP 10/18/20 0939 (S) (!) 144/78     Pulse Rate 10/18/20 0939 87     Resp 10/18/20 0939 16     Temp --      Temp src --      SpO2 10/18/20 0939 98 %     Weight 10/18/20 0937 225 lb (102.1 kg)     Height --      Head Circumference --      Peak Flow --      Pain Score 10/18/20 0937 0     Pain Loc --      Pain Edu? --      Excl. in [place]? --    No data found.  Updated Vital Signs BP (S) (!) 144/78  (BP Location: Right Arm)   Pulse 87   Temp 98.4 F (36.9 C) (Oral)   Resp 16   Wt 225 lb (102.1 kg)   LMP  (LMP Unknown)   SpO2 98%   BMI 38.62 kg/m   Visual Acuity Right Eye Distance:   Left Eye Distance:   Bilateral Distance:    Right Eye Near:   Left Eye Near:    Bilateral Near:     Physical Exam Vitals and nursing note reviewed.  Constitutional:      General: She is not in acute distress.    Appearance: She is well-developed. She is not toxic-appearing.  HENT:     Head: Normocephalic and atraumatic.  Cardiovascular:     Rate and Rhythm: Normal rate and regular rhythm.     Pulses: Normal pulses.     Heart sounds: Normal heart sounds. No murmur heard.   Pulmonary:     Effort: Pulmonary effort is normal.     Breath sounds: No wheezing, rhonchi or rales.  Abdominal:     General: Bowel sounds are normal. There is distension.     Palpations: Abdomen is soft.     Tenderness: There is no abdominal tenderness. There is no right CVA tenderness, left CVA tenderness, guarding or rebound.  Skin:    General: Skin is warm and dry.     Capillary Refill: Capillary refill takes less than 2 seconds.     Findings: No erythema or rash.  Neurological:     General: No focal deficit present.     Mental Status: She is alert and oriented to person, place, and time.  Psychiatric:        Mood and Affect: Mood normal.        Behavior: Behavior normal.        Thought Content: Thought content normal.        Judgment: Judgment normal.      UC Treatments / Results  Labs (all labs ordered are listed, but only abnormal results are displayed) Labs Reviewed  CBC WITH DIFFERENTIAL/PLATELET - Abnormal; Notable for the following components:      Result Value   Monocytes Absolute 1.1 (*)    All other components within normal limits  COMPREHENSIVE METABOLIC PANEL - Abnormal; Notable for the following components:   Potassium 3.1 (*)    Glucose, Bld 109 (*)    Total Protein 8.2 (*)    All  other components within normal limits  URINALYSIS, COMPLETE (UACMP) WITH MICROSCOPIC -  Abnormal; Notable for the following components:   APPearance HAZY (*)    Hgb urine dipstick MODERATE (*)    Ketones, ur TRACE (*)    Protein, ur 30 (*)    Nitrite POSITIVE (*)    Leukocytes,Ua LARGE (*)    Bacteria, UA MANY (*)    All other components within normal limits  URINE CULTURE    EKG   Radiology No results found.  Procedures Procedures (including critical care time)  Medications Ordered in UC Medications - No data to display  Initial Impression / Assessment and Plan / UC Course  I have reviewed the triage vital signs and the nursing notes.  Pertinent labs & imaging results that were available during my care of the patient were reviewed by me and considered in my medical decision making (see chart for details).   The patient is here for evaluation of multiple complaints.  Patient is a very pleasant 50 year old female who recently had a right ureteral stent for complications related to a kidney stone.  Patient is due to have the stent out tomorrow.  Patient spiked a fever up to 103-104 5 days, contacted urology who recommended ER evaluation for lab work.  Patient reports that she went to the ER but left after 2 hours without having lab work done.  Patient not have any upper respiratory or lower respiratory complaints.  Patient's complaints are largely constitutional and nature.  In the setting of recent ureteral stent with cloudiness to her urine and fever there is concern patient may be developing urinary tract infection, pyelonephritis, or urosepsis.  Patient's vital signs are stable here.  We will obtain urinalysis, CBC, and CMP.  CBC is unremarkable with a WBC count of 7.7, H&H of 13.2 and 38.5.  CMP shows mild hypokalemia with a potassium of 3.1, glucose is slightly elevated at 109 BUN and creatinine are normal at 10 and 0.65 no elevation to transaminases.  UA reveals moderate  hemoglobin, trace ketones, 30 protein, nitrite positive, large leukocytes, 21-50 WBCs, many bacteria, and white blood cells in clumps.  We will send urine for culture.  We will diagnose patient with complicated UTI and treat with Cipro 500 mg twice daily x7 days.   Final Clinical Impressions(s) / UC Diagnoses   Final diagnoses:  Complicated UTI (urinary tract infection)     Discharge Instructions     Take the Cipro twice daily for 7 days.  Keep your appointment with urology for tomorrow as scheduled for stent removal.  They can review the results of the urinalysis in epic.  I am sending your urine for culture and if we need to change the antibiotics based on the result we will when the results are back.  If you develop a fever, nausea and vomiting, or shaking chills you need to go to the ER for evaluation.    ED Prescriptions    Medication Sig Dispense Auth. Provider   ciprofloxacin (CIPRO) 500 MG tablet Take 1 tablet (500 mg total) by mouth 2 (two) times daily. 14 tablet Margarette Canada, NP     PDMP not reviewed this encounter.   Margarette Canada, NP 10/18/20 1141

## 2020-10-18 NOTE — Discharge Instructions (Addendum)
Take the Cipro twice daily for 7 days.  Keep your appointment with urology for tomorrow as scheduled for stent removal.  They can review the results of the urinalysis in epic.  I am sending your urine for culture and if we need to change the antibiotics based on the result we will when the results are back.  If you develop a fever, nausea and vomiting, or shaking chills you need to go to the ER for evaluation.

## 2020-10-21 LAB — URINE CULTURE
Culture: >=100000 — AB
Special Requests: NORMAL

## 2020-10-25 ENCOUNTER — Telehealth (HOSPITAL_COMMUNITY): Payer: Self-pay | Admitting: Emergency Medicine

## 2020-10-25 MED ORDER — NITROFURANTOIN MONOHYD MACRO 100 MG PO CAPS: 100.0000 mg | ORAL_CAPSULE | Freq: Two times a day (BID) | ORAL | 0 refills | Status: DC

## 2021-12-01 ENCOUNTER — Encounter: Payer: Self-pay | Admitting: Podiatry

## 2021-12-01 ENCOUNTER — Other Ambulatory Visit: Payer: Self-pay

## 2021-12-01 ENCOUNTER — Ambulatory Visit (INDEPENDENT_AMBULATORY_CARE_PROVIDER_SITE_OTHER): Payer: BC Managed Care – PPO | Admitting: Podiatry

## 2021-12-01 ENCOUNTER — Ambulatory Visit (INDEPENDENT_AMBULATORY_CARE_PROVIDER_SITE_OTHER): Payer: BC Managed Care – PPO

## 2021-12-01 DIAGNOSIS — M7752 Other enthesopathy of left foot: Secondary | ICD-10-CM

## 2021-12-01 DIAGNOSIS — M778 Other enthesopathies, not elsewhere classified: Secondary | ICD-10-CM

## 2021-12-01 DIAGNOSIS — M722 Plantar fascial fibromatosis: Secondary | ICD-10-CM | POA: Diagnosis not present

## 2021-12-01 MED ORDER — MELOXICAM 15 MG PO TABS: 15.0000 mg | ORAL_TABLET | Freq: Every day | ORAL | 1 refills | Status: DC

## 2021-12-01 MED ORDER — METHYLPREDNISOLONE 4 MG PO TBPK: ORAL_TABLET | ORAL | 0 refills | Status: DC

## 2021-12-01 NOTE — Telephone Encounter (Signed)
Please advise 

## 2021-12-08 DIAGNOSIS — M778 Other enthesopathies, not elsewhere classified: Secondary | ICD-10-CM | POA: Diagnosis not present

## 2021-12-08 DIAGNOSIS — M722 Plantar fascial fibromatosis: Secondary | ICD-10-CM | POA: Diagnosis not present

## 2021-12-08 MED ORDER — BETAMETHASONE SOD PHOS & ACET 6 (3-3) MG/ML IJ SUSP
3.0000 mg | Freq: Once | INTRAMUSCULAR | Status: AC
  Administered 2021-12-08: 3 mg via INTRA_ARTICULAR

## 2021-12-08 NOTE — Progress Notes (Signed)
° °  Subjective: 51 y.o. female presenting today as a reestablish new patient for evaluation of bilateral foot pain.  Patient does have a history of bunionectomy surgery to the left foot several years prior.  Most recently she has been exposed.  Seen pain and tenderness to the left midfoot as well as the right heel.  Is been ongoing for a few weeks now.  She does have a history of plantar fasciitis to the right heel which has been intermittent on and off.  She has been using a tennis ball and stretching the right foot.  She presents for further treatment and evaluation   Past Medical History:  Diagnosis Date   Thyroid disease      Objective: Physical Exam General: The patient is alert and oriented x3 in no acute distress.  Dermatology: Skin is warm, dry and supple bilateral lower extremities. Negative for open lesions or macerations bilateral.   Vascular: Dorsalis Pedis and Posterior Tibial pulses palpable bilateral.  Capillary fill time is immediate to all digits.  Neurological: Epicritic and protective threshold intact bilateral.   Musculoskeletal: Tenderness to palpation to the plantar aspect of the right heel along the plantar fascia as well as along the dorsal midfoot left. All other joints range of motion within normal limits bilateral. Strength 5/5 in all groups bilateral.   Radiographic exam bilateral foot 3 views: Normal osseous mineralization. Joint spaces preserved. No fracture/dislocation/boney destruction. No other soft tissue abnormalities or radiopaque foreign bodies.  There are 2 crossing K wires noted to the first metatarsal of the left foot.  There is also some degenerative changes noted throughout the midtarsal joints of the left foot  Assessment: 1. Plantar fasciitis right 2.  DJD/capsulitis left midfoot 3. H/o bunionectomy with osteotomy left foot  Plan of Care:  1. Patient evaluated. Xrays reviewed.   2. Injection of 0.5cc Celestone soluspan injected into the  right plantar fascia as well as the left dorsal midfoot 3. Rx for Medrol Dose Pack placed 4. Rx for Meloxicam ordered for patient. 5.  Recommend good supportive shoes and sneakers 6. Instructed patient regarding therapies and modalities at home to alleviate symptoms.  7. Return to clinic in 4 weeks.    *Going to [place] with her kids graduating high school field trip 01/02/2022 *Works for [employer] from [place].  Works from home   Edrick Kins, DPM [facility]  Dr. Edrick Kins, DPM    [address], [place] [address]                Office [phone]  Fax [phone]

## 2021-12-27 DIAGNOSIS — M1711 Unilateral primary osteoarthritis, right knee: Secondary | ICD-10-CM | POA: Insufficient documentation

## 2021-12-29 ENCOUNTER — Ambulatory Visit: Payer: BC Managed Care – PPO | Admitting: Podiatry

## 2022-01-26 ENCOUNTER — Other Ambulatory Visit: Payer: Self-pay | Admitting: Podiatry

## 2022-06-18 ENCOUNTER — Ambulatory Visit
Admission: EM | Admit: 2022-06-18 | Discharge: 2022-06-18 | Disposition: A | Discharge status: Home / Self Care | Payer: BC Managed Care – PPO | Attending: Physician Assistant | Admitting: Physician Assistant

## 2022-06-18 DIAGNOSIS — N200 Calculus of kidney: Secondary | ICD-10-CM | POA: Diagnosis present

## 2022-06-18 DIAGNOSIS — N390 Urinary tract infection, site not specified: Secondary | ICD-10-CM | POA: Insufficient documentation

## 2022-06-18 DIAGNOSIS — R350 Frequency of micturition: Secondary | ICD-10-CM | POA: Insufficient documentation

## 2022-06-18 LAB — URINALYSIS, MICROSCOPIC (REFLEX): WBC, UA: >50 WBC/hpf (ref 0–5)

## 2022-06-18 LAB — URINALYSIS, ROUTINE W REFLEX MICROSCOPIC
Bilirubin Urine: NEGATIVE
Glucose, UA: NEGATIVE mg/dL
Ketones, ur: NEGATIVE mg/dL
Nitrite: POSITIVE — AB
Protein, ur: 30 mg/dL — AB
Specific Gravity, Urine: 1.025 (ref 1.005–1.030)
pH: 6 (ref 5.0–8.0)

## 2022-06-18 MED ORDER — CIPROFLOXACIN HCL 500 MG PO TABS: 500.0000 mg | ORAL_TABLET | Freq: Two times a day (BID) | ORAL | 0 refills | Status: AC

## 2022-06-18 NOTE — ED Triage Notes (Signed)
Pt c/o uti frequency, pressure, headache, nausea x1-2weeks  Pt was recently in the ER for diverticulitis and kidney stones.  Pt recently stopped taking Flomax.

## 2022-06-18 NOTE — ED Provider Notes (Signed)
[facility]    CSN: [id number] Arrival date & time: 06/18/22  1757      History   Chief Complaint Chief Complaint  Patient presents with   Urinary Tract Infection    HPI Alexine Pilant is a 51 y.o. female presenting for 3 to 4-day history of urinary frequency and bladder pressure.  She reports headaches and nausea.  Patient reports that 1-1/2 weeks ago she was in the emergency department for abdominal pain and was found to have kidney stones and also diverticulitis and was prescribed Augmentin.  She took the full course.  She says her abdominal pain went away and now she just feels bladder pressure.  Denies painful urination.  Denies flank pain or hematuria.  History of similar symptoms when she had a kidney stone last year followed by urinary tract infection.  No other complaints.  HPI  Past Medical History:  Diagnosis Date   Thyroid disease     Patient Active Problem List   Diagnosis Date Noted   Degenerative joint disease involving multiple joints 04/06/2020   Adenomatous polyp of transverse colon 04/03/2017   Disease of thyroid gland 01/27/2016   Cervical os stenosis 08/24/2015   Deep dyspareunia 08/24/2015   Dysmenorrhea 08/24/2015   Fibroids, submucosal 08/24/2015   Abnormal LFTs 09/20/2014   Referred by specialist physician 09/20/2014    Past Surgical History:  Procedure Laterality Date   ABDOMINAL HYSTERECTOMY  11/2015   CESAREAN SECTION     FOOT SURGERY Left    UTERINE STENT PLACEMENT      OB History   No obstetric history on file.      Home Medications    Prior to Admission medications   Medication Sig Start Date End Date Taking? Authorizing Provider  Cholecalciferol 25 MCG (1000 UT) capsule Take by mouth.   Yes [provider]  diclofenac Sodium (VOLTAREN) 1 % GEL Voltaren 1 % topical gel  APP 2 GRAMS TO AFFECTED AREAS QID   Yes [provider]  NP THYROID PO Take 75 mcg by mouth.   Yes [provider]   thyroid (ARMOUR) 30 MG tablet Take 30 mg by mouth daily before breakfast.   Yes [provider]  ciprofloxacin (CIPRO) 500 MG tablet Take 1 tablet (500 mg total) by mouth 2 (two) times daily for 7 days. 06/18/22 06/25/22  Danton Clap, PA-C  diclofenac (VOLTAREN) 75 MG EC tablet Take 1 tablet (75 mg total) by mouth 2 (two) times daily. 03/13/16   [name], DPM  meloxicam (MOBIC) 15 MG tablet TAKE 1 TABLET(15 MG) BY MOUTH DAILY 01/26/22   Edrick Kins, DPM  metFORMIN (GLUCOPHAGE-XR) 500 MG 24 hr tablet metformin ER 500 mg tablet,extended release 24 hr  TK 1 T PO BID WITH MEALS    [provider]    Family History Family History  Problem Relation Age of Onset   Alzheimer's disease Mother    Hypertension Father     Social History Social History   Tobacco Use   Smoking status: Never   Smokeless tobacco: Never  Vaping Use   Vaping Use: Never used  Substance Use Topics   Alcohol use: No   Drug use: No     Allergies   Codeine   Review of Systems Review of Systems  Constitutional:  Negative for chills, fatigue and fever.  Gastrointestinal:  Positive for abdominal pain. Negative for diarrhea, nausea and vomiting.  Genitourinary:  Positive for frequency and urgency. Negative for  decreased urine volume, dysuria, flank pain, hematuria, pelvic pain, vaginal bleeding, vaginal discharge and vaginal pain.  Musculoskeletal:  Negative for back pain.  Skin:  Negative for rash.     Physical Exam Triage Vital Signs ED Triage Vitals  Enc Vitals Group     BP 06/18/22 1809 (!) 138/95     Pulse Rate 06/18/22 1809 78     Resp 06/18/22 1809 18     Temp 06/18/22 1809 98.1 F (36.7 C)     Temp Source 06/18/22 1809 Oral     SpO2 06/18/22 1809 100 %     Weight 06/18/22 1807 220 lb (99.8 kg)     Height 06/18/22 1807 '5\' 4"'$  (1.626 m)     Head Circumference --      Peak Flow --      Pain Score 06/18/22 1807 0     Pain Loc --      Pain Edu? --      Excl. in [place]? --     No data found.  Updated Vital Signs BP (!) 138/95 (BP Location: Left Arm)   Pulse 78   Temp 98.1 F (36.7 C) (Oral)   Resp 18   Ht '5\' 4"'$  (1.626 m)   Wt 220 lb (99.8 kg)   LMP  (LMP Unknown)   SpO2 100%   BMI 37.76 kg/m     Physical Exam Vitals and nursing note reviewed.  Constitutional:      General: She is not in acute distress.    Appearance: Normal appearance. She is ill-appearing. She is not toxic-appearing.  HENT:     Head: Normocephalic and atraumatic.     Nose: Nose normal.     Mouth/Throat:     Mouth: Mucous membranes are moist.     Pharynx: Oropharynx is clear.  Eyes:     General: No scleral icterus.       Right eye: No discharge.        Left eye: No discharge.     Conjunctiva/sclera: Conjunctivae normal.  Cardiovascular:     Rate and Rhythm: Normal rate and regular rhythm.     Heart sounds: Normal heart sounds.  Pulmonary:     Effort: Pulmonary effort is normal. No respiratory distress.     Breath sounds: Normal breath sounds.  Abdominal:     Palpations: Abdomen is soft.     Tenderness: There is abdominal tenderness (suprapubic, mild). There is no right CVA tenderness or left CVA tenderness.  Musculoskeletal:     Cervical back: Neck supple.  Skin:    General: Skin is dry.  Neurological:     General: No focal deficit present.     Mental Status: She is alert. Mental status is at baseline.     Motor: No weakness.     Gait: Gait normal.  Psychiatric:        Mood and Affect: Mood normal.        Behavior: Behavior normal.        Thought Content: Thought content normal.      UC Treatments / Results  Labs (all labs ordered are listed, but only abnormal results are displayed) Labs Reviewed  URINALYSIS, ROUTINE W REFLEX MICROSCOPIC - Abnormal; Notable for the following components:      Result Value   APPearance HAZY (*)    Hgb urine dipstick SMALL (*)    Protein, ur 30 (*)    Nitrite POSITIVE (*)    Leukocytes,Ua MODERATE (*)    All  other  components within normal limits  URINALYSIS, MICROSCOPIC (REFLEX) - Abnormal; Notable for the following components:   Bacteria, UA MANY (*)    All other components within normal limits  URINE CULTURE    EKG   Radiology No results found.  Procedures Procedures (including critical care time)  Medications Ordered in UC Medications - No data to display  Initial Impression / Assessment and Plan / UC Course  I have reviewed the triage vital signs and the nursing notes.  Pertinent labs & imaging results that were available during my care of the patient were reviewed by me and considered in my medical decision making (see chart for details).   51 year old female presents for bladder pressure and urinary frequency for the past 3 to 4 days.  Recent flareup of diverticulitis and kidney stones about 1-1/2 weeks ago.  Took Augmentin for a week.  Presently not having any fevers, flank pain.  Vitals are stable.  She is mildly ill-appearing and uncomfortable but nontoxic.  On exam she has mild tenderness to palpation of the suprapubic region no guarding or rebound.  No CVA tenderness.  Chest clear to auscultation heart regular rate and rhythm.  Urinalysis shows hazy urine with small blood, protein, positive nitrites and moderate leukocytes with many bacteria and WBC clumps.  Urine sent for culture.  Discussed results with patient and advised her she has a complicated urinary tract infection.  We will treat with Cipro at this time.  Encouraged her to increase her fluid intake and rest.  Reviewed return and ER precautions.   Final Clinical Impressions(s) / UC Diagnoses   Final diagnoses:  Complicated urinary tract infection  Urinary frequency  Nephrolithiasis     Discharge Instructions      UTI: Based on either symptoms or urinalysis, you may have a urinary tract infection. We will send the urine for culture and call with results in a few days. Begin antibiotics at this time. Your symptoms  should be much improved over the next 2-3 days. Increase rest and fluid intake. If for some reason symptoms are worsening or not improving after a couple of days or the urine culture determines the antibiotics you are taking will not treat the infection, the antibiotics may be changed. Return or go to ER for fever, back pain, worsening urinary pain, discharge, increased blood in urine. May take Tylenol or Motrin OTC for pain relief or consider AZO if no contraindications      ED Prescriptions     Medication Sig Dispense Auth. Provider   ciprofloxacin (CIPRO) 500 MG tablet Take 1 tablet (500 mg total) by mouth 2 (two) times daily for 7 days. 14 tablet Gretta Cool      PDMP not reviewed this encounter.   Danton Clap, PA-C 06/18/22 1841

## 2022-06-18 NOTE — Discharge Instructions (Signed)

## 2022-06-20 LAB — URINE CULTURE

## 2022-07-02 ENCOUNTER — Ambulatory Visit
Admission: EM | Admit: 2022-07-02 | Discharge: 2022-07-02 | Disposition: A | Discharge status: Home / Self Care | Payer: BC Managed Care – PPO | Attending: Internal Medicine | Admitting: Internal Medicine

## 2022-07-02 DIAGNOSIS — R112 Nausea with vomiting, unspecified: Secondary | ICD-10-CM | POA: Insufficient documentation

## 2022-07-02 LAB — CBC WITH DIFFERENTIAL/PLATELET
Abs Immature Granulocytes: 0.03 10*3/uL (ref 0.00–0.07)
Basophils Absolute: 0.1 10*3/uL (ref 0.0–0.1)
Basophils Relative: 1 %
Eosinophils Absolute: 0 10*3/uL (ref 0.0–0.5)
Eosinophils Relative: 1 %
HCT: 42.7 % (ref 36.0–46.0)
Hemoglobin: 15 g/dL (ref 12.0–15.0)
Immature Granulocytes: 0 %
Lymphocytes Relative: 21 %
Lymphs Abs: 1.6 10*3/uL (ref 0.7–4.0)
MCH: 31.3 pg (ref 26.0–34.0)
MCHC: 35.1 g/dL (ref 30.0–36.0)
MCV: 89.1 fL (ref 80.0–100.0)
Monocytes Absolute: 0.5 10*3/uL (ref 0.1–1.0)
Monocytes Relative: 7 %
Neutro Abs: 5.6 10*3/uL (ref 1.7–7.7)
Neutrophils Relative %: 70 %
Platelets: 376 10*3/uL (ref 150–400)
RBC: 4.79 MIL/uL (ref 3.87–5.11)
RDW: 12.4 % (ref 11.5–15.5)
WBC: 7.8 10*3/uL (ref 4.0–10.5)
nRBC: 0 % (ref 0.0–0.2)

## 2022-07-02 LAB — COMPREHENSIVE METABOLIC PANEL
ALT: 59 U/L — ABNORMAL HIGH (ref 0–44)
AST: 36 U/L (ref 15–41)
Albumin: 4.5 g/dL (ref 3.5–5.0)
Alkaline Phosphatase: 61 U/L (ref 38–126)
Anion gap: 9 (ref 5–15)
BUN: 13 mg/dL (ref 6–20)
CO2: 23 mmol/L (ref 22–32)
Calcium: 9.4 mg/dL (ref 8.9–10.3)
Chloride: 106 mmol/L (ref 98–111)
Creatinine, Ser: 0.59 mg/dL (ref 0.44–1.00)
GFR, Estimated: >60 mL/min (ref >60)
Glucose, Bld: 145 mg/dL — ABNORMAL HIGH (ref 70–99)
Potassium: 3.7 mmol/L (ref 3.5–5.1)
Sodium: 138 mmol/L (ref 135–145)
Total Bilirubin: 0.5 mg/dL (ref 0.3–1.2)
Total Protein: 8.2 g/dL — ABNORMAL HIGH (ref 6.5–8.1)

## 2022-07-02 LAB — URINALYSIS, ROUTINE W REFLEX MICROSCOPIC
Bilirubin Urine: NEGATIVE
Glucose, UA: NEGATIVE mg/dL
Hgb urine dipstick: NEGATIVE
Ketones, ur: NEGATIVE mg/dL
Leukocytes,Ua: NEGATIVE
Nitrite: NEGATIVE
Protein, ur: 100 mg/dL — AB
Specific Gravity, Urine: 1.025 (ref 1.005–1.030)
pH: 7 (ref 5.0–8.0)

## 2022-07-02 LAB — URINALYSIS, MICROSCOPIC (REFLEX)

## 2022-07-02 LAB — GLUCOSE, CAPILLARY: Glucose-Capillary: 143 mg/dL — ABNORMAL HIGH (ref 70–99)

## 2022-07-02 MED ORDER — ONDANSETRON 4 MG PO TBDP
4.0000 mg | ORAL_TABLET | Freq: Once | ORAL | Status: AC
  Administered 2022-07-02: 4 mg via ORAL

## 2022-07-02 MED ORDER — PROMETHAZINE HCL 25 MG/ML IJ SOLN
25.0000 mg | Freq: Once | INTRAMUSCULAR | Status: AC
  Administered 2022-07-02: 25 mg via INTRAMUSCULAR

## 2022-07-02 NOTE — Discharge Instructions (Addendum)
I am sending you to the ER for further work up

## 2022-07-02 NOTE — ED Notes (Signed)
Patient is being discharged from the Urgent Care and sent to the Emergency Department via personal vehicle with spouse . Per [place], PA, patient is in need of higher level of care due to uncontrolled vomiting. Patient is aware and verbalizes understanding of plan of care.  Vitals:   07/02/22 1241  BP: (!) 154/94  Pulse: 74  Resp: 20  Temp: 97.7 F (36.5 C)  SpO2: 100%

## 2022-07-02 NOTE — ED Triage Notes (Signed)
Patient presents to UC for n/v and dizziness since today. About an hr ago. Took a dose of ibuprofen. BG 114. 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   Denies chest pain or changes to diet or medications.

## 2022-07-02 NOTE — ED Provider Notes (Addendum)
[facility]    CSN: [id number] Arrival date & time: 07/02/22  1225      History   Chief Complaint Chief Complaint  Patient presents with   Emesis   Dizziness    HPI Dana Douglas is a 51 y.o. 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 She was fine when she went to bed last night. Nausea hit her first and suddenly, then got sweaty, and nauseous, so thinking it could be low blood sugar she drank a coke thinking it and a bite of care, and ended up vomiting x 1. She checked her sugar after drinking the coke as her glucose  was 114.  She did not eat anything this am. She took her Ibuprofen for her OA as usual on empty stomach and has never had issues with this since she takes it this way always.  Yesterday, she felt dizzy and she checked her sugar and was 80, drank a coke and was fine the rest of the evening. She denies chest pain, new meds or abdominal pain.    Past Medical History:  Diagnosis Date   Thyroid disease     Patient Active Problem List   Diagnosis Date Noted   Primary osteoarthritis of one knee, right 12/27/2021   Degenerative joint disease involving multiple joints 04/06/2020   Adenomatous polyp of transverse colon 04/03/2017   Disease of thyroid gland 01/27/2016   Cervical os stenosis 08/24/2015   Deep dyspareunia 08/24/2015   Dysmenorrhea 08/24/2015   Fibroids, submucosal 08/24/2015   Abnormal LFTs 09/20/2014   Referred by specialist physician 09/20/2014    Past Surgical History:  Procedure Laterality Date   ABDOMINAL HYSTERECTOMY  11/2015   CESAREAN SECTION     FOOT SURGERY Left    UTERINE STENT PLACEMENT      OB History   No obstetric history on file.      Home Medications    Prior to Admission medications   Medication Sig Start Date End Date Taking? Authorizing Provider  Cholecalciferol 25 MCG (1000 UT) capsule Take by mouth.    [provider]  diclofenac (VOLTAREN) 75 MG EC tablet Take 1 tablet (75 mg total) by mouth 2 (two) times daily. 03/13/16   [name], DPM  diclofenac Sodium (VOLTAREN) 1 % GEL Voltaren 1 % topical gel  APP 2 GRAMS TO AFFECTED AREAS QID    [provider]  meloxicam (MOBIC) 15 MG tablet TAKE 1 TABLET(15 MG) BY MOUTH DAILY 01/26/22   Edrick Kins, DPM  metFORMIN (GLUCOPHAGE-XR) 500 MG 24 hr tablet metformin ER 500 mg tablet,extended release 24 hr  TK 1 T PO BID WITH MEALS    [provider]  NP THYROID PO Take 75 mcg by mouth.    [provider]  thyroid (ARMOUR) 30 MG tablet Take 30 mg by mouth daily before breakfast.    [provider]    Family History Family History  Problem Relation Age of Onset   Alzheimer's disease Mother    Hypertension Father     Social History Social History   Tobacco Use   Smoking status: Never   Smokeless tobacco: Never  Vaping Use   Vaping Use: Never used  Substance Use Topics   Alcohol use: No   Drug use: No     Allergies   Codeine   Review of Systems Review of Systems  Respiratory:  Negative for shortness of breath.   Cardiovascular:  Negative for chest pain.  Gastrointestinal:  Positive for nausea and vomiting. Negative for abdominal pain.  Genitourinary:  Negative for dysuria.  Neurological:  Positive for dizziness.     Physical Exam Triage Vital Signs ED Triage Vitals  Enc Vitals Group     BP 07/02/22 1241 (!) 154/94     Pulse Rate 07/02/22 1241 74     Resp 07/02/22 1241 20     Temp 07/02/22 1241 97.7 F (36.5 C)     Temp Source 07/02/22 1241 Oral     SpO2 07/02/22 1241 100 %     Weight --      Height --      Head Circumference --      Peak Flow --      Pain Score 07/02/22 1243 0     Pain Loc --      Pain Edu? --      Excl. in [place]? --    No data found.  Updated Vital Signs BP (!) 154/94 (BP Location: Left Arm)   Pulse 74   Temp 97.7 F (36.5 C) (Oral)   Resp 20   LMP  (LMP Unknown)    SpO2 100%   Visual Acuity Right Eye Distance:   Left Eye Distance:   Bilateral Distance:    Right Eye Near:   Left Eye Near:    Bilateral Near:     Physical Exam Vitals and nursing note reviewed.  Constitutional:      General: She is in acute distress.     Comments: Pale, breathing heavy, skin is a little clammy  HENT:     Head: Normocephalic.  Eyes:     General: No scleral icterus.    Conjunctiva/sclera: Conjunctivae normal.  Cardiovascular:     Rate and Rhythm: Normal rate and regular rhythm.     Heart sounds: No murmur heard. Pulmonary:     Effort: Pulmonary effort is normal.     Breath sounds: Normal breath sounds.  Abdominal:     General: Bowel sounds are normal.     Palpations: Abdomen is soft.     Tenderness: There is no abdominal tenderness. There is no guarding or rebound.  Musculoskeletal:        General: Normal range of motion.     Cervical back: Neck supple.  Skin:    General: Skin is warm.  Neurological:     Mental Status: She is oriented to person, place, and time.     Gait: Gait normal.  Psychiatric:        Mood and Affect: Mood normal.        Behavior: Behavior normal.        Thought Content: Thought content normal.        Judgment: Judgment normal.      UC Treatments / Results  Labs (all labs ordered are listed, but only abnormal results are displayed) Labs Reviewed  URINALYSIS, ROUTINE W REFLEX MICROSCOPIC - Abnormal; Notable for the following components:      Result Value   Protein, ur 100 (*)    All other components within normal limits  COMPREHENSIVE METABOLIC PANEL - Abnormal; Notable for the following components:   Glucose, Bld 145 (*)    Total Protein 8.2 (*)    ALT 59 (*)    All other components within normal limits  URINALYSIS, MICROSCOPIC (REFLEX) - Abnormal; Notable for the following components:   Bacteria, UA RARE (*)  All other components within normal limits  GLUCOSE, CAPILLARY - Abnormal; Notable for the following  components:   Glucose-Capillary 143 (*)    All other components within normal limits  CBC WITH DIFFERENTIAL/PLATELET  CBG MONITORING, ED    EKG Normal Sinus rhythm. Normal EKG  Radiology No results found.  Procedures Procedures (including critical care time)  Medications Ordered in UC Medications  ondansetron (ZOFRAN-ODT) disintegrating tablet 4 mg (4 mg Oral Given 07/02/22 1246)  ondansetron (ZOFRAN-ODT) disintegrating tablet 4 mg (4 mg Oral Given 07/02/22 1309)  promethazine (PHENERGAN) injection 25 mg (25 mg Intramuscular Given 07/02/22 1329)    Initial Impression / Assessment and Plan / UC Course  I have reviewed the triage vital signs and the nursing notes.  Pertinent labs  results that were available during my care of the patient were reviewed by me and considered in my medical decision making (see chart for details).  CBC and CMP are still pending,but pt was not responding to 2 doses of Zofran and Phenergan 25 mg IM. She vomited 5 more times while here.   She was sent to ER, her husband wanted to take her to [facility].    Final Clinical Impressions(s) / UC Diagnoses   Final diagnoses:  Nausea and vomiting, unspecified vomiting type     Discharge Instructions      I am sending you to the ER for further work up     ED Prescriptions   None    PDMP not reviewed this encounter.   Shelby Mattocks, PA-C 07/02/22 1611    Rodriguez-[place], Sunday Spillers, PA-C 07/02/22 1612

## 2023-01-18 DIAGNOSIS — E6609 Other obesity due to excess calories: Secondary | ICD-10-CM | POA: Insufficient documentation

## 2024-09-07 ENCOUNTER — Ambulatory Visit
Admission: EM | Admit: 2024-09-07 | Discharge: 2024-09-07 | Disposition: A | Discharge status: Home / Self Care | Attending: Emergency Medicine | Admitting: Emergency Medicine

## 2024-09-07 DIAGNOSIS — N390 Urinary tract infection, site not specified: Secondary | ICD-10-CM | POA: Diagnosis present

## 2024-09-07 DIAGNOSIS — R319 Hematuria, unspecified: Secondary | ICD-10-CM | POA: Diagnosis not present

## 2024-09-07 DIAGNOSIS — K5792 Diverticulitis of intestine, part unspecified, without perforation or abscess without bleeding: Secondary | ICD-10-CM | POA: Insufficient documentation

## 2024-09-07 LAB — POCT URINE DIPSTICK
Bilirubin, UA: NEGATIVE
Glucose, UA: NEGATIVE mg/dL
Ketones, POC UA: NEGATIVE mg/dL
Nitrite, UA: POSITIVE — AB
Protein Ur, POC: 30 mg/dL — AB
Spec Grav, UA: 1.02 (ref 1.010–1.025)
Urobilinogen, UA: 0.2 U/dL
pH, UA: 6 (ref 5.0–8.0)

## 2024-09-07 MED ORDER — PHENAZOPYRIDINE HCL 200 MG PO TABS: 200.0000 mg | ORAL_TABLET | Freq: Three times a day (TID) | ORAL | 0 refills | Status: AC | PRN

## 2024-09-07 MED ORDER — NITROFURANTOIN MONOHYD MACRO 100 MG PO CAPS: 100.0000 mg | ORAL_CAPSULE | Freq: Two times a day (BID) | ORAL | 0 refills | Status: AC

## 2024-09-07 NOTE — Discharge Instructions (Signed)
 I have sent your urine off for culture to make sure that we have you on the correct antibiotic.  We will contact you if we need to change it.  Continue pushing fluids.  The Pyridium  will turn your urine orange, but will help with your symptoms.  Finish the Macrobid , even if you feel better.

## 2024-09-07 NOTE — ED Provider Notes (Signed)
 HPI  SUBJECTIVE:  Dana Douglas is a 53 y.o. female who presents with pelvic pressure, dysuria, urgency and frequency for the past 4 to 5 days.  She denies cloudy or odorous urine, hematuria, nausea, vomiting, fevers, abdominal, back, pelvic pain.  No vaginal complaints.  She is in a long-term monogamous relationship with her husband, who is asymptomatic.  STDs are not a concern today.  No antibiotics in the past month.  No antipyretic in the past 6 hours.  She tried Azo with some improvement in her symptoms.  Symptoms are worse with urinating.  She has a past medical history of UTI, obstructing nephrolithiasis, diverticulitis, osteoarthritis, vaginal yeast infections and is status post hysterectomy.  No history of pyelonephritis, STDs, BV, diabetes, chronic kidney disease.  PCP: In [place].    Past Medical History:  Diagnosis Date   Thyroid disease     Past Surgical History:  Procedure Laterality Date   ABDOMINAL HYSTERECTOMY  11/2015   CESAREAN SECTION     FOOT SURGERY Left    UTERINE STENT PLACEMENT      Family History  Problem Relation Age of Onset   Alzheimer's disease Mother    Hypertension Father     Social History   Tobacco Use   Smoking status: Never   Smokeless tobacco: Never  Vaping Use   Vaping status: Never Used  Substance Use Topics   Alcohol use: No   Drug use: No     Current Facility-Administered Medications:    betamethasone  acetate-betamethasone  sodium phosphate (CELESTONE ) injection 3 mg, 3 mg, Intramuscular, Once, Evans, Brent M, DPM   triamcinolone  acetonide (KENALOG ) 10 MG/ML injection 10 mg, 10 mg, Other, Once, Stover, Titorya, DPM   triamcinolone  acetonide (KENALOG ) 10 MG/ML injection 10 mg, 10 mg, Other, Once, Stover, Titorya, DPM  Current Outpatient Medications:    nitrofurantoin , macrocrystal-monohydrate, (MACROBID ) 100 MG capsule, Take 1 capsule (100 mg total) by mouth 2 (two) times daily for 5 days., Disp: 10 capsule, Rfl: 0    phenazopyridine  (PYRIDIUM ) 200 MG tablet, Take 1 tablet (200 mg total) by mouth 3 (three) times daily as needed for pain., Disp: 6 tablet, Rfl: 0   Cholecalciferol 25 MCG (1000 UT) capsule, Take by mouth., Disp: , Rfl:    diclofenac  (VOLTAREN ) 75 MG EC tablet, Take 1 tablet (75 mg total) by mouth 2 (two) times daily., Disp: 30 tablet, Rfl: 0   diclofenac  Sodium (VOLTAREN ) 1 % GEL, Voltaren  1 % topical gel  APP 2 GRAMS TO AFFECTED AREAS QID, Disp: , Rfl:    meloxicam  (MOBIC ) 15 MG tablet, TAKE 1 TABLET(15 MG) BY MOUTH DAILY, Disp: 30 tablet, Rfl: 1   metFORMIN (GLUCOPHAGE-XR) 500 MG 24 hr tablet, metformin ER 500 mg tablet,extended release 24 hr  TK 1 T PO BID WITH MEALS, Disp: , Rfl:    NP THYROID PO, Take 75 mcg by mouth., Disp: , Rfl:    thyroid (ARMOUR) 30 MG tablet, Take 30 mg by mouth daily before breakfast., Disp: , Rfl:   Allergies  Allergen Reactions   Codeine Nausea And Vomiting and Other (See Comments)    codeine     ROS  As noted in HPI.   Physical Exam  BP (!) 148/90 (BP Location: Left Arm)   Pulse 90   Temp 98.4 F (36.9 C) (Oral)   Resp 16   Wt 111 kg   LMP  (LMP Unknown)   SpO2 99%   BMI 42.02 kg/m   Constitutional: Well developed, well nourished,  no acute distress Eyes:  EOMI, conjunctiva normal bilaterally HENT: Normocephalic, atraumatic,mucus membranes moist Respiratory: Normal inspiratory effort Cardiovascular: Normal rate GI: nondistended soft.  No suprapubic, flank tenderness Back: No CVAT skin: No rash, skin intact Musculoskeletal: no deformities Neurologic: Alert & oriented x 3, no focal neuro deficits Psychiatric: Speech and behavior appropriate   ED Course   Medications - No data to display  Orders Placed This Encounter  Procedures   Urine Culture    Standing Status:   Standing    Number of Occurrences:   1    Indication:   Dysuria   POC Urinalysis Dipstick    Standing Status:   Standing    Number of Occurrences:   1    Results  for orders placed or performed during the hospital encounter of 09/07/24 (from the past 24 hours)  POC Urinalysis Dipstick     Status: Abnormal   Collection Time: 09/07/24  2:02 PM  Result Value Ref Range   Color, UA yellow yellow   Clarity, UA cloudy (A) clear   Glucose, UA negative negative mg/dL   Bilirubin, UA negative negative   Ketones, POC UA negative negative mg/dL   Spec Grav, UA 8.979 8.989 - 1.025   Blood, UA moderate (A) negative   pH, UA 6.0 5.0 - 8.0   Protein Ur, POC =30 (A) negative mg/dL   Urobilinogen, UA 0.2 0.2 or 1.0 E.U./dL   Nitrite, UA Positive (A) Negative   Leukocytes, UA Large (3+) (A) Negative   No results found.  ED Clinical Impression  1. Urinary tract infection with hematuria, site unspecified      ED Assessment/Plan    [facility] and Care Everywhere labs reviewed.  Last positive urine culture I can find is January 2022 grew out E. coli sensitive to cephalosporins, Macrobid , Bactrim resistant to flouroquinolones and Augmentin.  Urine dip consistent with a UTI.  Will send off for culture to confirm antibiotic choice  Will send home with Macrobid  100 mg p.o. twice daily for 5 days, Pyridium  200 mg 3 times daily for 2 days.  Continue pushing fluids.  No evidence of pyelonephritis or obstructing nephrolithiasis at this time.  ER return precautions given.  Discussed labs, MDM, treatment plan, and plan for follow-up with patient. Discussed sn/sx that should prompt return to the ED. patient agrees with plan.   Meds ordered this encounter  Medications   nitrofurantoin , macrocrystal-monohydrate, (MACROBID ) 100 MG capsule    Sig: Take 1 capsule (100 mg total) by mouth 2 (two) times daily for 5 days.    Dispense:  10 capsule    Refill:  0   phenazopyridine  (PYRIDIUM ) 200 MG tablet    Sig: Take 1 tablet (200 mg total) by mouth 3 (three) times daily as needed for pain.    Dispense:  6 tablet    Refill:  0      *This clinic note was created using Administrator, sports. Therefore, there may be occasional mistakes despite careful proofreading.  ?    Van Knee, MD 09/07/24 1425

## 2024-09-07 NOTE — ED Triage Notes (Signed)
 Pt c/o urinary freq & pressure x2 days. Denies any pain or hematuria. Has tried OTC meds w/o relief.

## 2024-09-09 ENCOUNTER — Ambulatory Visit (HOSPITAL_COMMUNITY): Payer: Self-pay

## 2024-09-09 LAB — URINE CULTURE: Culture: 50000 — AB
